# Patient Record
Sex: Male | Born: 1945 | Race: White | Hispanic: No | Marital: Married | Smoking: Former smoker
Health system: Southern US, Community
[De-identification: ages and names within clinical notes are randomized; demographics above are authoritative.]

## PROBLEM LIST (undated history)

## (undated) DIAGNOSIS — E119 Type 2 diabetes mellitus without complications: Secondary | ICD-10-CM

## (undated) DIAGNOSIS — IMO0002 Reserved for concepts with insufficient information to code with codable children: Secondary | ICD-10-CM

## (undated) DIAGNOSIS — T7840XA Allergy, unspecified, initial encounter: Secondary | ICD-10-CM

## (undated) DIAGNOSIS — K219 Gastro-esophageal reflux disease without esophagitis: Secondary | ICD-10-CM

## (undated) DIAGNOSIS — I10 Essential (primary) hypertension: Secondary | ICD-10-CM

## (undated) DIAGNOSIS — J45909 Unspecified asthma, uncomplicated: Secondary | ICD-10-CM

## (undated) DIAGNOSIS — E039 Hypothyroidism, unspecified: Secondary | ICD-10-CM

## (undated) DIAGNOSIS — IMO0001 Reserved for inherently not codable concepts without codable children: Secondary | ICD-10-CM

## (undated) DIAGNOSIS — G4730 Sleep apnea, unspecified: Secondary | ICD-10-CM

## (undated) DIAGNOSIS — R21 Rash and other nonspecific skin eruption: Secondary | ICD-10-CM

## (undated) DIAGNOSIS — M5030 Other cervical disc degeneration, unspecified cervical region: Secondary | ICD-10-CM

## (undated) DIAGNOSIS — K4020 Bilateral inguinal hernia, without obstruction or gangrene, not specified as recurrent: Secondary | ICD-10-CM

## (undated) HISTORY — PX: VASECTOMY: SHX75

## (undated) HISTORY — DX: Unspecified asthma, uncomplicated: J45.909

## (undated) HISTORY — DX: Type 2 diabetes mellitus without complications: E11.9

## (undated) HISTORY — PX: OTHER SURGICAL HISTORY: SHX169

## (undated) HISTORY — DX: Allergy, unspecified, initial encounter: T78.40XA

## (undated) HISTORY — DX: Essential (primary) hypertension: I10

## (undated) HISTORY — PX: NASAL SINUS SURGERY: SHX719

---

## 2010-07-05 ENCOUNTER — Observation Stay (HOSPITAL_COMMUNITY)
Admission: AD | Admit: 2010-07-05 | Discharge: 2010-07-06 | Payer: Self-pay | Attending: Family Medicine | Admitting: Family Medicine

## 2010-09-26 LAB — GLUCOSE, CAPILLARY

## 2010-09-26 LAB — GLUCOSE, RANDOM: Glucose, Bld: 491 mg/dL — ABNORMAL HIGH (ref 70–99)

## 2011-02-24 ENCOUNTER — Other Ambulatory Visit: Payer: Self-pay | Admitting: Otolaryngology

## 2011-02-24 DIAGNOSIS — H7409 Tympanosclerosis, unspecified ear: Secondary | ICD-10-CM

## 2011-02-24 DIAGNOSIS — H6980 Other specified disorders of Eustachian tube, unspecified ear: Secondary | ICD-10-CM

## 2011-02-24 DIAGNOSIS — H908 Mixed conductive and sensorineural hearing loss, unspecified: Secondary | ICD-10-CM

## 2011-02-27 ENCOUNTER — Ambulatory Visit
Admission: RE | Admit: 2011-02-27 | Discharge: 2011-02-27 | Disposition: A | Discharge status: Home / Self Care | Payer: Medicare Other | Source: Ambulatory Visit | Attending: Otolaryngology | Admitting: Otolaryngology

## 2011-02-27 DIAGNOSIS — H7409 Tympanosclerosis, unspecified ear: Secondary | ICD-10-CM

## 2011-02-27 DIAGNOSIS — H908 Mixed conductive and sensorineural hearing loss, unspecified: Secondary | ICD-10-CM

## 2011-02-27 DIAGNOSIS — H6980 Other specified disorders of Eustachian tube, unspecified ear: Secondary | ICD-10-CM

## 2011-04-17 ENCOUNTER — Encounter (HOSPITAL_BASED_OUTPATIENT_CLINIC_OR_DEPARTMENT_OTHER)
Admission: RE | Admit: 2011-04-17 | Discharge: 2011-04-17 | Disposition: A | Discharge status: Home / Self Care | Payer: Medicare Other | Source: Ambulatory Visit | Attending: Orthopaedic Surgery | Admitting: Orthopaedic Surgery

## 2011-04-17 LAB — BASIC METABOLIC PANEL
CO2: 25 mEq/L (ref 19–32)
Chloride: 100 mEq/L (ref 96–112)
Creatinine, Ser: 0.94 mg/dL (ref 0.50–1.35)
Potassium: 4.4 mEq/L (ref 3.5–5.1)
Sodium: 135 mEq/L (ref 135–145)

## 2011-04-18 ENCOUNTER — Ambulatory Visit (HOSPITAL_BASED_OUTPATIENT_CLINIC_OR_DEPARTMENT_OTHER)
Admission: RE | Admit: 2011-04-18 | Discharge: 2011-04-18 | Disposition: A | Discharge status: Home / Self Care | Payer: Medicare Other | Source: Ambulatory Visit | Attending: Orthopaedic Surgery | Admitting: Orthopaedic Surgery

## 2011-04-18 DIAGNOSIS — M249 Joint derangement, unspecified: Secondary | ICD-10-CM | POA: Insufficient documentation

## 2011-04-18 DIAGNOSIS — M719 Bursopathy, unspecified: Secondary | ICD-10-CM | POA: Insufficient documentation

## 2011-04-18 DIAGNOSIS — Z01810 Encounter for preprocedural cardiovascular examination: Secondary | ICD-10-CM | POA: Insufficient documentation

## 2011-04-18 DIAGNOSIS — J45909 Unspecified asthma, uncomplicated: Secondary | ICD-10-CM | POA: Insufficient documentation

## 2011-04-18 DIAGNOSIS — M67919 Unspecified disorder of synovium and tendon, unspecified shoulder: Secondary | ICD-10-CM | POA: Insufficient documentation

## 2011-04-18 DIAGNOSIS — M25819 Other specified joint disorders, unspecified shoulder: Secondary | ICD-10-CM | POA: Insufficient documentation

## 2011-04-18 DIAGNOSIS — E119 Type 2 diabetes mellitus without complications: Secondary | ICD-10-CM | POA: Insufficient documentation

## 2011-04-18 DIAGNOSIS — Z01812 Encounter for preprocedural laboratory examination: Secondary | ICD-10-CM | POA: Insufficient documentation

## 2011-04-18 DIAGNOSIS — I10 Essential (primary) hypertension: Secondary | ICD-10-CM | POA: Insufficient documentation

## 2011-04-18 DIAGNOSIS — K219 Gastro-esophageal reflux disease without esophagitis: Secondary | ICD-10-CM | POA: Insufficient documentation

## 2011-04-18 LAB — GLUCOSE, CAPILLARY
Glucose-Capillary: 79 mg/dL (ref 70–99)
Glucose-Capillary: 132 mg/dL — ABNORMAL HIGH (ref 70–99)

## 2011-04-18 LAB — POCT HEMOGLOBIN-HEMACUE: Hemoglobin: 13.1 g/dL (ref 13.0–17.0)

## 2011-05-11 NOTE — Op Note (Signed)
NAMEMarland Pitts  BLAISE, GRIESHABER NO.:  [id number]  MEDICAL RECORD NO.:  [id number]  LOCATION:                                 FACILITY:  PHYSICIAN:  Lubertha Basque. Miko Sirico, M.D.DATE OF BIRTH:  07/01/1946  DATE OF PROCEDURE:  04/18/2011 DATE OF DISCHARGE:                              OPERATIVE REPORT   PREOPERATIVE DIAGNOSES: 1. Right shoulder impingement. 2. Right shoulder acromioclavicular degeneration.  POSTOPERATIVE DIAGNOSES: 1. Right shoulder impingement. 2. Right shoulder acromioclavicular degeneration.  PROCEDURE: 1. Right shoulder arthroscopic acromioplasty. 2. Right shoulder arthroscopic acromioclavicular resection. 3. Right shoulder arthroscopic debridement.  ANESTHESIA:  General and block.  ATTENDING SURGEON:  Lubertha Basque. Jerl Santos, MD  ASSISTANT:  Lindwood Qua, PA   INDICATIONS FOR PROCEDURE:  The patient is a 65 year old man with several years of right knee pain.  This has persisted despite anexercise program and injection.  By MRI scan, he has things consistent with impingement related to the shape of his acromion and the [facility] joint. He has pain which limits his ability to rest and use his arm and he is offered an arthroscopy.  Informed operative consent was obtained after discussion of possible complications including reaction to anesthesia and infection.  SUMMARY OF FINDINGS AND PROCEDURE:  Under general anesthesia and a block, a right shoulder arthroscopy was performed.  Glenohumeral joint showed no degenerative changes and the biceps tendon appeared intact. There were partial-thickness tears of the rotator cuff seen from below but they did not constitute more than about 10% of the thickness of the cuff.  In the subacromial space, he had some bursitis addressed with a thorough debridement.  He had a prominent subacromial morphology addressed with acromioplasty back to a flat surface.  He also had bone-on- bone contact at the [facility] joint and a  resection was done here as well.  DESCRIPTION OF PROCEDURE:  The patient was taken to the operating suite where general anesthetic was applied without difficulty.  He was also given a block in the preanesthesia area.  He was positioned in beach- chair position and prepped and draped in normal sterile fashion.  After administration of IV Kefzol, an arthroscopy of the right shoulder was performed through a total of 3 portals.  Findings were as noted above, and the procedure consisted of the debridement followed by an acromioplasty.  This was done with a bur in the lateral position followed by transfer of the bur to the posterior position.  We then performed a formal AC decompression removing the distal clavicle with the bur in the lateral position followed by transfer of the bur to the anterior position.  The cuff was thoroughly examined and no tear worthy of repair was found.  The shoulder was thoroughly irrigated followed by reapproximation of portals loosely with nylon.  Adaptic was applied followed by dry gauze and tape.  Estimated blood loss and intraoperative fluids can be obtained from anesthesia records.  DISPOSITION:  The patient was extubated in the operating room and taken to recovery room in stable addition.  He was to go home the same-day and follow up in the office in less than a week.  I will contact him by phone tonight.  Lubertha Basque Jerl Santos, M.D.     PGD/MEDQ  D:  04/18/2011  T:  04/18/2011  Job:  [id number]  Electronically Signed by Marcene Corning M.D. on 05/11/2011 01:41:22 PM

## 2011-07-19 DIAGNOSIS — M542 Cervicalgia: Secondary | ICD-10-CM | POA: Diagnosis not present

## 2011-08-30 DIAGNOSIS — M542 Cervicalgia: Secondary | ICD-10-CM | POA: Diagnosis not present

## 2011-09-05 DIAGNOSIS — M542 Cervicalgia: Secondary | ICD-10-CM | POA: Diagnosis not present

## 2011-09-11 DIAGNOSIS — M542 Cervicalgia: Secondary | ICD-10-CM | POA: Diagnosis not present

## 2011-09-13 DIAGNOSIS — M542 Cervicalgia: Secondary | ICD-10-CM | POA: Diagnosis not present

## 2011-09-15 DIAGNOSIS — M542 Cervicalgia: Secondary | ICD-10-CM | POA: Diagnosis not present

## 2011-09-18 DIAGNOSIS — M542 Cervicalgia: Secondary | ICD-10-CM | POA: Diagnosis not present

## 2011-09-20 DIAGNOSIS — M542 Cervicalgia: Secondary | ICD-10-CM | POA: Diagnosis not present

## 2011-09-22 DIAGNOSIS — M542 Cervicalgia: Secondary | ICD-10-CM | POA: Diagnosis not present

## 2011-09-22 DIAGNOSIS — M25519 Pain in unspecified shoulder: Secondary | ICD-10-CM | POA: Diagnosis not present

## 2011-09-25 DIAGNOSIS — M25519 Pain in unspecified shoulder: Secondary | ICD-10-CM | POA: Diagnosis not present

## 2011-09-25 DIAGNOSIS — M542 Cervicalgia: Secondary | ICD-10-CM | POA: Diagnosis not present

## 2011-09-27 DIAGNOSIS — M542 Cervicalgia: Secondary | ICD-10-CM | POA: Diagnosis not present

## 2011-09-29 DIAGNOSIS — M542 Cervicalgia: Secondary | ICD-10-CM | POA: Diagnosis not present

## 2011-10-04 DIAGNOSIS — M542 Cervicalgia: Secondary | ICD-10-CM | POA: Diagnosis not present

## 2011-10-09 DIAGNOSIS — M5030 Other cervical disc degeneration, unspecified cervical region: Secondary | ICD-10-CM | POA: Diagnosis not present

## 2011-10-11 DIAGNOSIS — M542 Cervicalgia: Secondary | ICD-10-CM | POA: Diagnosis not present

## 2011-10-23 DIAGNOSIS — M5412 Radiculopathy, cervical region: Secondary | ICD-10-CM | POA: Diagnosis not present

## 2011-11-20 DIAGNOSIS — M542 Cervicalgia: Secondary | ICD-10-CM | POA: Diagnosis not present

## 2011-11-28 DIAGNOSIS — M5412 Radiculopathy, cervical region: Secondary | ICD-10-CM | POA: Diagnosis not present

## 2011-12-18 DIAGNOSIS — M542 Cervicalgia: Secondary | ICD-10-CM | POA: Diagnosis not present

## 2012-02-12 DIAGNOSIS — H10019 Acute follicular conjunctivitis, unspecified eye: Secondary | ICD-10-CM | POA: Diagnosis not present

## 2012-02-12 DIAGNOSIS — J0110 Acute frontal sinusitis, unspecified: Secondary | ICD-10-CM | POA: Diagnosis not present

## 2012-04-08 DIAGNOSIS — L609 Nail disorder, unspecified: Secondary | ICD-10-CM | POA: Diagnosis not present

## 2012-04-08 DIAGNOSIS — D239 Other benign neoplasm of skin, unspecified: Secondary | ICD-10-CM | POA: Diagnosis not present

## 2012-04-08 DIAGNOSIS — D485 Neoplasm of uncertain behavior of skin: Secondary | ICD-10-CM | POA: Diagnosis not present

## 2012-04-08 DIAGNOSIS — L821 Other seborrheic keratosis: Secondary | ICD-10-CM | POA: Diagnosis not present

## 2012-04-08 DIAGNOSIS — L819 Disorder of pigmentation, unspecified: Secondary | ICD-10-CM | POA: Diagnosis not present

## 2012-04-30 DIAGNOSIS — Z23 Encounter for immunization: Secondary | ICD-10-CM | POA: Diagnosis not present

## 2012-05-06 DIAGNOSIS — M5412 Radiculopathy, cervical region: Secondary | ICD-10-CM | POA: Diagnosis not present

## 2012-07-23 DIAGNOSIS — L608 Other nail disorders: Secondary | ICD-10-CM | POA: Diagnosis not present

## 2012-07-23 DIAGNOSIS — L570 Actinic keratosis: Secondary | ICD-10-CM | POA: Diagnosis not present

## 2012-11-06 ENCOUNTER — Ambulatory Visit (INDEPENDENT_AMBULATORY_CARE_PROVIDER_SITE_OTHER): Payer: Medicare Other | Admitting: Family Medicine

## 2012-11-06 VITALS — BP 118/76 | HR 84 | Temp 98.2°F | Resp 16 | Ht 66.5 in | Wt 175.4 lb

## 2012-11-06 DIAGNOSIS — K4090 Unilateral inguinal hernia, without obstruction or gangrene, not specified as recurrent: Secondary | ICD-10-CM

## 2012-11-06 DIAGNOSIS — M6208 Separation of muscle (nontraumatic), other site: Secondary | ICD-10-CM

## 2012-11-06 DIAGNOSIS — M6200 Separation of muscle (nontraumatic), unspecified site: Secondary | ICD-10-CM | POA: Diagnosis not present

## 2012-11-06 NOTE — Patient Instructions (Addendum)

## 2012-11-06 NOTE — Progress Notes (Signed)
Subjective:    Patient ID: Logan Pitts, male    DOB: [birth year], 67 y.o.   MRN: [id number] Chief Complaint  Patient presents with  . Mass    left lower groin found over weekend denies pain   HPI  About 4d ago, pt felt a large bulge near his left groin. Not painful, just noticed it in shower, new and thinks it is getting a little larger.  It is much more prominent when he stands up, difficult to feel when he is laying flat.  Does not feel round, more squiggly inside, is above and to the left of his testicle, more pronounced today.  No testicular lumps or pain. Spent the week, cleaning out his garden boxes so has been doing a lot of lifting and recently joined a gym, trying to do weight lifting and core work.   No prior h/o hernia. Also over the past few wks has noticed a central protuberance above his belly button that sticks out when he is trying to do sit ups.  Prev worked for the [place] [employer], recently retired so now much more active at [employer] and lots of house renovation projects.  Has to have rotator cuff surgery in Rt shoulder soon.   Past Medical History  Diagnosis Date  . Allergy   . Asthma   . Diabetes mellitus without complication   . Thyroid disease   . Hypertension    Past Surgical History  Procedure Laterality Date  . Nasal sinus surgery    . Shoulder joint surgery     No current outpatient prescriptions on file prior to visit.   No current facility-administered medications on file prior to visit.   No Known Allergies Family History  Problem Relation Age of Onset  . Cancer Mother   . Heart disease Father    History   Social History  . Marital Status: Married    Spouse Name: N/A    Number of Children: N/A  . Years of Education: N/A   Social History Main Topics  . Smoking status: Former Games developer  . Smokeless tobacco: None  . Alcohol Use: None  . Drug Use: None  . Sexually Active: None   Other Topics Concern  . None   Social History Narrative  .  None     Review of Systems  Constitutional: Positive for appetite change. Negative for fever, chills, diaphoresis and activity change.  Respiratory: Negative for shortness of breath.   Cardiovascular: Negative for chest pain.  Gastrointestinal: Positive for abdominal distention. Negative for nausea, vomiting, abdominal pain, diarrhea, constipation, blood in stool, anal bleeding and rectal pain.  Genitourinary: Negative for dysuria, urgency, frequency, hematuria, decreased urine volume, discharge, penile swelling, scrotal swelling, enuresis, genital sores, penile pain and testicular pain.  Musculoskeletal: Positive for arthralgias. Negative for gait problem.  Skin: Negative for color change, rash and wound.  Neurological: Negative for weakness and numbness.  Hematological: Negative for adenopathy. Does not bruise/bleed easily.      BP 118/76  Pulse 84  Temp(Src) 98.2 F (36.8 C) (Oral)  Resp 16  Ht 5' 6.5" (1.689 m)  Wt 175 lb 6.4 oz (79.561 kg)  BMI 27.89 kg/m2  SpO2 98% Objective:   Physical Exam  Constitutional: He is oriented to person, place, and time. He appears well-developed and well-nourished. No distress.  HENT:  Head: Normocephalic and atraumatic.  Eyes: No scleral icterus.  Pulmonary/Chest: Effort normal.  Abdominal: Normal appearance and bowel sounds are normal. There is no tenderness.  A hernia is present. Hernia confirmed positive in the ventral area and confirmed positive in the left inguinal area. Hernia confirmed negative in the right inguinal area.  Medium diastasis recti hernia in epigastric region.  Lymphadenopathy:       Right: No inguinal adenopathy present.       Left: No inguinal adenopathy present.  Neurological: He is alert and oriented to person, place, and time.  Skin: Skin is warm and dry. He is not diaphoretic.  Psychiatric: He has a normal mood and affect. His behavior is normal.          Assessment & Plan:  Left Inguinal hernia - Plan:  Ambulatory referral to General Surgery.  Do not do any intentional weight lifting/traning but ok to continue normal activities, home projects, aerobic exercise.  Can wear truss if getting worse or painful.  Warning given to seek medical care immed for signs of incarceration or strangulation.  Diastasis recti - Plan: Ambulatory referral to General Surgery - likely more cosmetic concern.   Meds ordered this encounter  Medications  . levothyroxine (SYNTHROID, LEVOTHROID) 200 MCG tablet    Sig: Take 200 mcg by mouth daily before breakfast.  . atorvastatin (LIPITOR) 80 MG tablet    Sig: Take 80 mg by mouth daily.  Marland Kitchen ezetimibe (ZETIA) 10 MG tablet    Sig: Take 10 mg by mouth daily.  Marland Kitchen lisinopril (PRINIVIL,ZESTRIL) 40 MG tablet    Sig: Take 40 mg by mouth daily.  Marland Kitchen amLODipine (NORVASC) 10 MG tablet    Sig: Take 10 mg by mouth daily.  . flunisolide (NASALIDE) 25 MCG/ACT (0.025%) SOLN    Sig: Inhale 2 sprays into the lungs 2 (two) times daily.  . insulin glargine (LANTUS) 100 UNIT/ML injection    Sig: Inject into the skin at bedtime.  . insulin aspart protamine- aspart (NOVOLOG 70/30) (70-30) 100 UNIT/ML injection    Sig: Inject into the skin.  Marland Kitchen omeprazole (PRILOSEC) 20 MG capsule    Sig: Take 20 mg by mouth daily.  Marland Kitchen albuterol (PROVENTIL HFA;VENTOLIN HFA) 108 (90 BASE) MCG/ACT inhaler    Sig: Inhale 2 puffs into the lungs every 6 (six) hours as needed for wheezing.  . mometasone (ASMANEX) 220 MCG/INH inhaler    Sig: Inhale 2 puffs into the lungs daily.  Marland Kitchen aspirin 81 MG tablet    Sig: Take 81 mg by mouth daily.  Marland Kitchen ibuprofen (ADVIL,MOTRIN) 200 MG tablet    Sig: Take 200 mg by mouth every 6 (six) hours as needed for pain.  . vardenafil (LEVITRA) 20 MG tablet    Sig: Take 20 mg by mouth daily as needed for erectile dysfunction.

## 2012-11-18 ENCOUNTER — Encounter (INDEPENDENT_AMBULATORY_CARE_PROVIDER_SITE_OTHER): Payer: Self-pay | Admitting: General Surgery

## 2012-11-18 ENCOUNTER — Ambulatory Visit (INDEPENDENT_AMBULATORY_CARE_PROVIDER_SITE_OTHER): Payer: Medicare Other | Admitting: General Surgery

## 2012-11-18 VITALS — BP 144/72 | HR 74 | Temp 98.0°F | Ht 67.5 in | Wt 176.4 lb

## 2012-11-18 DIAGNOSIS — K4020 Bilateral inguinal hernia, without obstruction or gangrene, not specified as recurrent: Secondary | ICD-10-CM

## 2012-11-18 NOTE — Progress Notes (Signed)
Patient ID: Logan Pitts, male   DOB: September 01, 1945, 67 y.o.   MRN: [id number]  Chief Complaint  Patient presents with  . New Evaluation    eval LIH    HPI Logan Pitts is a 67 y.o. male.   HPI  He is referred by Dr. Meriel Pitts for left inguinal hernia.  She resumed A. Exercise routine and was doing squats as well as a lot of crunches. He noticed a bulge in the left inguinal area. He also noticed a bulge in the epigastrium. Dr. Clelia Pitts  examined him and found that he had a diastases recti in the epigastric region. She also felt a left inguinal hernia. He notices a bulge in his left groin as well. He is here to discuss repair.    Past Medical History  Diagnosis Date  . Allergy   . Asthma   . Diabetes mellitus without complication   . Thyroid disease   . Hypertension     Past Surgical History  Procedure Laterality Date  . Nasal sinus surgery    . Shoulder joint surgery      Family History  Problem Relation Age of Onset  . Cancer Mother   . Heart disease Father   . Cancer Sister     Social History History  Substance Use Topics  . Smoking status: Former Games developer  . Smokeless tobacco: Not on file  . Alcohol Use: Yes     Comment: social    No Known Allergies  Current Outpatient Prescriptions  Medication Sig Dispense Refill  . albuterol (PROVENTIL HFA;VENTOLIN HFA) 108 (90 BASE) MCG/ACT inhaler Inhale 2 puffs into the lungs every 6 (six) hours as needed for wheezing.      Marland Kitchen amLODipine (NORVASC) 10 MG tablet Take 10 mg by mouth daily.      Marland Kitchen aspirin 81 MG tablet Take 81 mg by mouth daily.      Marland Kitchen atorvastatin (LIPITOR) 80 MG tablet Take 80 mg by mouth daily.      Marland Kitchen ezetimibe (ZETIA) 10 MG tablet Take 10 mg by mouth daily.      . flunisolide (NASALIDE) 25 MCG/ACT (0.025%) SOLN Inhale 2 sprays into the lungs 2 (two) times daily.      Marland Kitchen ibuprofen (ADVIL,MOTRIN) 200 MG tablet Take 200 mg by mouth every 6 (six) hours as needed for pain.      Marland Kitchen insulin aspart protamine- aspart  (NOVOLOG 70/30) (70-30) 100 UNIT/ML injection Inject into the skin.      Marland Kitchen insulin glargine (LANTUS) 100 UNIT/ML injection Inject into the skin at bedtime.      Marland Kitchen levothyroxine (SYNTHROID, LEVOTHROID) 200 MCG tablet Take 200 mcg by mouth daily before breakfast.      . lisinopril (PRINIVIL,ZESTRIL) 40 MG tablet Take 40 mg by mouth daily.      . meloxicam (MOBIC) 15 MG tablet       . mometasone (ASMANEX) 220 MCG/INH inhaler Inhale 2 puffs into the lungs daily.      Marland Kitchen omeprazole (PRILOSEC) 20 MG capsule Take 20 mg by mouth daily.      . vardenafil (LEVITRA) 20 MG tablet Take 20 mg by mouth daily as needed for erectile dysfunction.       No current facility-administered medications for this visit.    Review of Systems Review of Systems  Constitutional: Negative.   HENT: Negative.   Respiratory: Negative.   Cardiovascular: Negative.   Gastrointestinal: Negative.   Genitourinary: Negative for difficulty urinating.  Musculoskeletal: Positive  for arthralgias.  Hematological: Negative.     Blood pressure 144/72, pulse 74, temperature 98 F (36.7 C), temperature source Temporal, height 5' 7.5" (1.715 m), weight 176 lb 6.4 oz (80.015 kg), SpO2 96.00%.  Physical Exam Physical Exam  Constitutional: He appears well-developed and well-nourished. No distress.  HENT:  Head: Normocephalic and atraumatic.  Neck: Neck supple.  Cardiovascular: Normal rate and regular rhythm.   Pulmonary/Chest: Effort normal and breath sounds normal.  Abdominal: Soft. He exhibits no distension and no mass. There is no tenderness.  Tiny umbilical hernia.  Diastases recti is present.  Genitourinary:  Reducible, visible left inguinal bulge. Tiny right inguinal bulge only felt with a cough.  Musculoskeletal: He exhibits no edema.  Lymphadenopathy:    He has no cervical adenopathy.  Skin: Skin is warm and dry.  Psychiatric: He has a normal mood and affect. His behavior is normal.    Data Reviewed Dr. Alver Pitts  note.  Assessment    Bilateral inguinal hernias left significantly larger than the right. Also has a tiny umbilical hernia.     Plan    We discussed left inguinal hernia with mesh and On Q pump placement by way of open technique. We also discussed laparoscopic bilateral inguinal hernia repair. He is only interested in repairing the left side at this time by way of the open technique.   I have explained the procedure, risks, and aftercare of inguinal hernia repair.  Risks include but are not limited to bleeding, infection, wound problems, anesthesia, recurrence, bladder or intestine injury, urinary retention, testicular dysfunction, chronic pain, mesh problems. He seems to understand all this. He would like to go home and discuss with his wife and calls back when he would like to schedule her surgery. I've also discussed the importance of good blood sugar control in the perioperative period.       Logan Pitts J 11/18/2012, 2:51 PM

## 2012-11-18 NOTE — Patient Instructions (Signed)
Avoid heavy abdominal exercises and heavy weight lifting. Please call us when you would like to schedule repair of the left inguinal hernia.

## 2013-01-20 DIAGNOSIS — M5412 Radiculopathy, cervical region: Secondary | ICD-10-CM | POA: Diagnosis not present

## 2013-02-04 DIAGNOSIS — M5412 Radiculopathy, cervical region: Secondary | ICD-10-CM | POA: Diagnosis not present

## 2013-03-21 DIAGNOSIS — M542 Cervicalgia: Secondary | ICD-10-CM | POA: Diagnosis not present

## 2013-04-16 DIAGNOSIS — D239 Other benign neoplasm of skin, unspecified: Secondary | ICD-10-CM | POA: Diagnosis not present

## 2013-04-16 DIAGNOSIS — L821 Other seborrheic keratosis: Secondary | ICD-10-CM | POA: Diagnosis not present

## 2013-04-16 DIAGNOSIS — B354 Tinea corporis: Secondary | ICD-10-CM | POA: Diagnosis not present

## 2013-04-16 DIAGNOSIS — D485 Neoplasm of uncertain behavior of skin: Secondary | ICD-10-CM | POA: Diagnosis not present

## 2013-04-16 DIAGNOSIS — D692 Other nonthrombocytopenic purpura: Secondary | ICD-10-CM | POA: Diagnosis not present

## 2013-04-16 DIAGNOSIS — L570 Actinic keratosis: Secondary | ICD-10-CM | POA: Diagnosis not present

## 2013-05-27 DIAGNOSIS — D485 Neoplasm of uncertain behavior of skin: Secondary | ICD-10-CM | POA: Diagnosis not present

## 2013-05-27 DIAGNOSIS — L820 Inflamed seborrheic keratosis: Secondary | ICD-10-CM | POA: Diagnosis not present

## 2013-07-25 ENCOUNTER — Encounter (INDEPENDENT_AMBULATORY_CARE_PROVIDER_SITE_OTHER): Payer: Self-pay | Admitting: General Surgery

## 2013-07-25 ENCOUNTER — Ambulatory Visit (INDEPENDENT_AMBULATORY_CARE_PROVIDER_SITE_OTHER): Payer: Medicare Other | Admitting: General Surgery

## 2013-07-25 VITALS — BP 128/68 | HR 84 | Temp 98.9°F | Resp 14 | Ht 67.5 in | Wt 186.2 lb

## 2013-07-25 DIAGNOSIS — K4020 Bilateral inguinal hernia, without obstruction or gangrene, not specified as recurrent: Secondary | ICD-10-CM

## 2013-07-25 NOTE — Progress Notes (Signed)
Patient ID: Logan Pitts, male   DOB: July 22, 1945, 68 y.o.   MRN: [id number]  Chief Complaint  Patient presents with  . Routine Post Op    reck hernia and discuss sx    HPI Logan Pitts is a 68 y.o. male.   HPI  I saw Logan Pitts back in May of 2014. At that time he has a symptomatic left inguinal hernia. He also had a small right inguinal hernia and a tiny umbilical hernia. He had some work to do over the summer so as not interested in repair that time. He presents now to discuss repair.  Past Medical History  Diagnosis Date  . Allergy   . Asthma   . Diabetes mellitus without complication   . Thyroid disease   . Hypertension     Past Surgical History  Procedure Laterality Date  . Nasal sinus surgery    . Shoulder joint surgery      Family History  Problem Relation Age of Onset  . Cancer Mother   . Heart disease Father   . Cancer Sister     Social History History  Substance Use Topics  . Smoking status: Former Research scientist (life sciences)  . Smokeless tobacco: Not on file  . Alcohol Use: Yes     Comment: social    No Known Allergies  Current Outpatient Prescriptions  Medication Sig Dispense Refill  . albuterol (PROVENTIL HFA;VENTOLIN HFA) 108 (90 BASE) MCG/ACT inhaler Inhale 2 puffs into the lungs every 6 (six) hours as needed for wheezing.      Marland Kitchen amLODipine (NORVASC) 10 MG tablet Take 10 mg by mouth daily.      Marland Kitchen aspirin 81 MG tablet Take 81 mg by mouth daily.      Marland Kitchen atorvastatin (LIPITOR) 80 MG tablet Take 80 mg by mouth daily.      Marland Kitchen ezetimibe (ZETIA) 10 MG tablet Take 10 mg by mouth daily.      . flunisolide (NASALIDE) 25 MCG/ACT (0.025%) SOLN Inhale 2 sprays into the lungs 2 (two) times daily.      Marland Kitchen ibuprofen (ADVIL,MOTRIN) 200 MG tablet Take 200 mg by mouth every 6 (six) hours as needed for pain.      Marland Kitchen insulin aspart protamine- aspart (NOVOLOG 70/30) (70-30) 100 UNIT/ML injection Inject into the skin.      Marland Kitchen insulin glargine (LANTUS) 100 UNIT/ML injection Inject into the  skin at bedtime.      Marland Kitchen levothyroxine (SYNTHROID, LEVOTHROID) 200 MCG tablet Take 200 mcg by mouth daily before breakfast.      . lisinopril (PRINIVIL,ZESTRIL) 40 MG tablet Take 40 mg by mouth daily.      . meloxicam (MOBIC) 15 MG tablet       . mometasone (ASMANEX) 220 MCG/INH inhaler Inhale 2 puffs into the lungs daily.      Marland Kitchen omeprazole (PRILOSEC) 20 MG capsule Take 20 mg by mouth daily.      Marland Kitchen PRECISION XTRA TEST STRIPS test strip       . TECHLITE LANCETS MISC       . vardenafil (LEVITRA) 20 MG tablet Take 20 mg by mouth daily as needed for erectile dysfunction.       No current facility-administered medications for this visit.    Review of Systems Review of Systems  Constitutional: Negative.   Gastrointestinal: Negative for constipation.  Genitourinary: Negative for difficulty urinating.  Hematological: Bruises/bleeds easily.    Blood pressure 128/68, pulse 84, temperature 98.9 F (37.2 C), temperature source  Temporal, resp. rate 14, height 5' 7.5" (1.715 m), weight 186 lb 3.2 oz (84.46 kg).  Physical Exam Physical Exam  Constitutional: He appears well-developed and well-nourished. No distress.  HENT:  Head: Normocephalic and atraumatic.  Eyes: No scleral icterus.  Cardiovascular: Normal rate and regular rhythm.   Pulmonary/Chest: Effort normal and breath sounds normal.  Abdominal: Soft. He exhibits no mass. There is no tenderness.  He has a diastases recti in the epigastric region. He has a very tiny umbilical hernia.  Genitourinary:  There is a moderate sized left inguinal bulge that is reducible. There is a small right inguinal bulge felt with a cough.  Musculoskeletal: He exhibits no edema.    Data Reviewed Previous note  Assessment    Bilateral inguinal hernias. He is interested in having both repaired.     Plan    Laparoscopic bilateral inguinal hernia repair with mesh.    I have explained the procedure, risks, and aftercare of inguinal hernia repair.   Risks include but are not limited to bleeding, infection, wound problems, anesthesia, recurrence, bladder or intestine injury, urinary retention, testicular dysfunction, chronic pain, mesh problems.  He seems to understand and agrees to proceed.  I have asked him to stop his aspirin one week prior to surgery.       Oline Belk J 07/25/2013, 4:14 PM

## 2013-07-25 NOTE — Patient Instructions (Signed)
[facility] _______[facility]  UMBILICAL OR INGUINAL HERNIA REPAIR: POST OP INSTRUCTIONS  Always review your discharge instruction sheet given to you by the facility where your surgery was performed. IF YOU HAVE DISABILITY OR FAMILY LEAVE FORMS, YOU MUST BRING THEM TO THE OFFICE FOR PROCESSING.   DO NOT GIVE THEM TO YOUR DOCTOR.  1. A  prescription for pain medication may be given to you upon discharge.  Take your pain medication as prescribed, if needed.  If narcotic pain medicine is not needed, then you may take acetaminophen (Tylenol) or ibuprofen (Advil) as needed. 2. Take your usually prescribed medications unless otherwise directed. 3. If you need a refill on your pain medication, please contact your pharmacy.  They will contact our office to request authorization. Prescriptions will not be filled after 5 pm or on week-ends. 4. You should follow a light diet the first 24 hours after arrival home, such as soup and crackers, etc.  Be sure to include lots of fluids daily.  Resume your normal diet the day after surgery. 5. Most patients will experience some swelling and bruising around the umbilicus or in the groin and scrotum.  Ice packs and reclining will help.  Swelling and bruising can take several days to resolve.  6. It is common to experience some constipation if taking pain medication after surgery.  Increasing fluid intake and taking a stool softener (such as Colace) will usually help or prevent this problem from occurring.  A mild laxative (Milk of Magnesia or Miralax) should be taken according to package directions if there are no bowel movements after 48 hours. 7. Unless discharge instructions indicate otherwise, you may remove your bandages 24-48 hours after surgery, and you may shower at that time.  You may have steri-strips (small skin tapes) in place directly over the incision.  These strips should be left on the skin for 7-10 days.  If your surgeon used skin glue on the  incision, you may shower in 24 hours.  The glue will flake off over the next 2-3 weeks.  Any sutures or staples will be removed at the office during your follow-up visit. 8. ACTIVITIES:  You may resume regular (light) daily activities beginning the next day--such as daily self-care, walking, climbing stairs--gradually increasing activities as tolerated.  You may have sexual intercourse when it is comfortable.  Refrain from any heavy lifting or straining until approved by your doctor. a. You may drive when you are no longer taking prescription pain medication, you can comfortably wear a seatbelt, and you can safely maneuver your car and apply brakes. b. RETURN TO WORK:  __________________________________________________________ 9. You should see your doctor in the office for a follow-up appointment approximately 2-3 weeks after your surgery.  Make sure that you call for this appointment within a day or two after you arrive home to insure a convenient appointment time. 10. OTHER INSTRUCTIONS:  __________________________________________________________________________________________________________________________________________________________________________________________  WHEN TO CALL YOUR DOCTOR: 1. Fever over 101.0 2. Inability to urinate 3. Nausea and/or vomiting 4. Extreme swelling or bruising 5. Continued bleeding from incision. 6. Increased pain, redness, or drainage from the incision  The clinic staff is available to answer your questions during regular business hours.  Please don't hesitate to call and ask to speak to one of the nurses for clinical concerns.  If you have a medical emergency, go to the nearest emergency room or call 911.  A surgeon from [facility] is always on call at the hospital     [address], [address], [place], [place]  [address] ?  [address] [address], [place], [place]   [address] [phone] ? [phone] ? FAX [phone] Web site:  [email or web address]  

## 2013-08-21 ENCOUNTER — Encounter (HOSPITAL_COMMUNITY)
Admission: RE | Admit: 2013-08-21 | Discharge: 2013-08-21 | Disposition: A | Discharge status: Home / Self Care | Payer: Medicare Other | Source: Ambulatory Visit | Attending: General Surgery | Admitting: General Surgery

## 2013-08-21 ENCOUNTER — Ambulatory Visit (HOSPITAL_COMMUNITY)
Admission: RE | Admit: 2013-08-21 | Discharge: 2013-08-21 | Disposition: A | Discharge status: Home / Self Care | Payer: Medicare Other | Source: Ambulatory Visit | Attending: General Surgery | Admitting: General Surgery

## 2013-08-21 ENCOUNTER — Encounter (HOSPITAL_COMMUNITY): Payer: Self-pay

## 2013-08-21 ENCOUNTER — Encounter (HOSPITAL_COMMUNITY): Payer: Self-pay | Admitting: Pharmacy Technician

## 2013-08-21 DIAGNOSIS — K4020 Bilateral inguinal hernia, without obstruction or gangrene, not specified as recurrent: Secondary | ICD-10-CM | POA: Diagnosis not present

## 2013-08-21 DIAGNOSIS — Z01818 Encounter for other preprocedural examination: Secondary | ICD-10-CM | POA: Diagnosis not present

## 2013-08-21 DIAGNOSIS — Z01812 Encounter for preprocedural laboratory examination: Secondary | ICD-10-CM | POA: Insufficient documentation

## 2013-08-21 DIAGNOSIS — Z01810 Encounter for preprocedural cardiovascular examination: Secondary | ICD-10-CM | POA: Insufficient documentation

## 2013-08-21 DIAGNOSIS — I10 Essential (primary) hypertension: Secondary | ICD-10-CM | POA: Diagnosis not present

## 2013-08-21 HISTORY — DX: Rash and other nonspecific skin eruption: R21

## 2013-08-21 HISTORY — DX: Sleep apnea, unspecified: G47.30

## 2013-08-21 HISTORY — DX: Reserved for inherently not codable concepts without codable children: IMO0001

## 2013-08-21 HISTORY — DX: Reserved for concepts with insufficient information to code with codable children: IMO0002

## 2013-08-21 HISTORY — DX: Gastro-esophageal reflux disease without esophagitis: K21.9

## 2013-08-21 HISTORY — DX: Other cervical disc degeneration, unspecified cervical region: M50.30

## 2013-08-21 HISTORY — DX: Bilateral inguinal hernia, without obstruction or gangrene, not specified as recurrent: K40.20

## 2013-08-21 HISTORY — DX: Hypothyroidism, unspecified: E03.9

## 2013-08-21 LAB — CBC WITH DIFFERENTIAL/PLATELET
BASOS PCT: 1 % (ref 0–1)
Basophils Absolute: 0 10*3/uL (ref 0.0–0.1)
Eosinophils Absolute: 0.2 10*3/uL (ref 0.0–0.7)
Eosinophils Relative: 4 % (ref 0–5)
HEMATOCRIT: 40.8 % (ref 39.0–52.0)
HEMOGLOBIN: 14.2 g/dL (ref 13.0–17.0)
Lymphocytes Relative: 29 % (ref 12–46)
Lymphs Abs: 1.4 10*3/uL (ref 0.7–4.0)
MCH: 31.6 pg (ref 26.0–34.0)
MCHC: 34.8 g/dL (ref 30.0–36.0)
MCV: 90.7 fL (ref 78.0–100.0)
MONO ABS: 0.4 10*3/uL (ref 0.1–1.0)
MONOS PCT: 9 % (ref 3–12)
NEUTROS ABS: 2.8 10*3/uL (ref 1.7–7.7)
Neutrophils Relative %: 58 % (ref 43–77)
Platelets: 240 10*3/uL (ref 150–400)
RBC: 4.5 MIL/uL (ref 4.22–5.81)
RDW: 13 % (ref 11.5–15.5)
WBC: 4.9 10*3/uL (ref 4.0–10.5)

## 2013-08-21 LAB — COMPREHENSIVE METABOLIC PANEL
ALT: 23 U/L (ref 0–53)
AST: 23 U/L (ref 0–37)
Albumin: 4.2 g/dL (ref 3.5–5.2)
Alkaline Phosphatase: 72 U/L (ref 39–117)
BILIRUBIN TOTAL: 1.7 mg/dL — AB (ref 0.3–1.2)
BUN: 19 mg/dL (ref 6–23)
CO2: 26 mEq/L (ref 19–32)
CREATININE: 1.16 mg/dL (ref 0.50–1.35)
Calcium: 9.2 mg/dL (ref 8.4–10.5)
Chloride: 102 mEq/L (ref 96–112)
GFR calc Af Amer: 73 mL/min — ABNORMAL LOW (ref >90)
GFR calc non Af Amer: 63 mL/min — ABNORMAL LOW (ref >90)
Glucose, Bld: 78 mg/dL (ref 70–99)
Potassium: 4.5 mEq/L (ref 3.7–5.3)
Sodium: 138 mEq/L (ref 137–147)
Total Protein: 7.1 g/dL (ref 6.0–8.3)

## 2013-08-21 LAB — PROTIME-INR
INR: 0.98 (ref 0.00–1.49)
PROTHROMBIN TIME: 12.8 s (ref 11.6–15.2)

## 2013-08-21 NOTE — Patient Instructions (Addendum)
Logan Pitts  08/21/2013                           YOUR PROCEDURE IS SCHEDULED ON: 08/26/13               PLEASE REPORT TO SHORT STAY CENTER AT : 5:30 AM               CALL THIS NUMBER IF ANY PROBLEMS THE DAY OF SURGERY :               [phone]                                REMEMBER:   Do not eat food or drink liquids AFTER MIDNIGHT     Take these medicines the morning of surgery with A SIP OF WATER:  AMLODIPINE / LIPITOR / ZETIA / SYNTHROID / PRILOSEC / MAY TAKE HUMIBID - SUDAFED IF NEEDED / TAKE 1/2 DOSE OF LANTUS INSULIN THE NIGHT BEFORE SURGERY   Do not wear jewelry, make-up   Do not wear lotions, powders, or perfumes.   Do not shave legs or underarms 12 hrs. before surgery (men may shave face)  Do not bring valuables to the hospital.  Contacts, dentures or bridgework may not be worn into surgery.  Leave suitcase in the car. After surgery it may be brought to your room.                           Patients discharged the day of surgery will not be allowed to drive home.              If going home same day of surgery, must have someone stay with you first              24 hrs at home and arrange for some one to drive you home from hospital.    Special Instructions:   Please read over the following fact sheets that you were given:               1. [place]                2. DISCONTINUE ASPIRIN AND HERBAL MEDS 5 DAYS PREOP               3. BRING C PAP MASK AND TUBING TO HOSPITAL               4. BRING INHALER (ALBUTEROL) [name]                                                X_____________________________________________________________________        Failure to follow these instructions may result in cancellation of your surgery

## 2013-08-25 ENCOUNTER — Encounter (HOSPITAL_COMMUNITY): Payer: Self-pay | Admitting: Anesthesiology

## 2013-08-25 NOTE — Anesthesia Preprocedure Evaluation (Signed)
Anesthesia Evaluation  Patient identified by MRN, date of birth, ID band Patient awake    Reviewed: Allergy & Precautions, H&P , NPO status , Patient's Chart, lab work & pertinent test results  Airway Mallampati: II TM Distance: >3 FB Neck ROM: Full    Dental no notable dental hx.    Pulmonary asthma , sleep apnea , former smoker,  breath sounds clear to auscultation  Pulmonary exam normal       Cardiovascular Exercise Tolerance: Good hypertension, Pt. on medications Rhythm:Regular Rate:Normal     Neuro/Psych negative neurological ROS  negative psych ROS   GI/Hepatic Neg liver ROS, GERD-  Medicated,  Endo/Other  diabetes, Type 1, Insulin DependentHypothyroidism   Renal/GU negative Renal ROS  negative genitourinary   Musculoskeletal negative musculoskeletal ROS (+)   Abdominal   Peds negative pediatric ROS (+)  Hematology negative hematology ROS (+)   Anesthesia Other Findings   Reproductive/Obstetrics negative OB ROS                           Anesthesia Physical Anesthesia Plan  ASA: III  Anesthesia Plan: General   Post-op Pain Management:    Induction: Intravenous  Airway Management Planned: Oral ETT  Additional Equipment:   Intra-op Plan:   Post-operative Plan: Extubation in OR  Informed Consent: I have reviewed the patients History and Physical, chart, labs and discussed the procedure including the risks, benefits and alternatives for the proposed anesthesia with the patient or authorized representative who has indicated his/her understanding and acceptance.   Dental advisory given  Plan Discussed with: CRNA  Anesthesia Plan Comments:         Anesthesia Quick Evaluation

## 2013-08-26 ENCOUNTER — Encounter (HOSPITAL_COMMUNITY): Payer: Self-pay | Admitting: *Deleted

## 2013-08-26 ENCOUNTER — Ambulatory Visit (HOSPITAL_COMMUNITY)
Admission: RE | Admit: 2013-08-26 | Discharge: 2013-08-26 | Disposition: A | Discharge status: Home / Self Care | Payer: Medicare Other | Source: Ambulatory Visit | Attending: General Surgery | Admitting: General Surgery

## 2013-08-26 ENCOUNTER — Encounter (HOSPITAL_COMMUNITY): Payer: Medicare Other | Admitting: Anesthesiology

## 2013-08-26 ENCOUNTER — Ambulatory Visit (HOSPITAL_COMMUNITY): Payer: Medicare Other | Admitting: Anesthesiology

## 2013-08-26 ENCOUNTER — Encounter (HOSPITAL_COMMUNITY)
Admission: RE | Disposition: A | Discharge status: Home / Self Care | Payer: Self-pay | Source: Ambulatory Visit | Attending: General Surgery

## 2013-08-26 DIAGNOSIS — Z87891 Personal history of nicotine dependence: Secondary | ICD-10-CM | POA: Insufficient documentation

## 2013-08-26 DIAGNOSIS — G4730 Sleep apnea, unspecified: Secondary | ICD-10-CM | POA: Insufficient documentation

## 2013-08-26 DIAGNOSIS — E039 Hypothyroidism, unspecified: Secondary | ICD-10-CM | POA: Diagnosis not present

## 2013-08-26 DIAGNOSIS — J45909 Unspecified asthma, uncomplicated: Secondary | ICD-10-CM | POA: Diagnosis not present

## 2013-08-26 DIAGNOSIS — Z79899 Other long term (current) drug therapy: Secondary | ICD-10-CM | POA: Diagnosis not present

## 2013-08-26 DIAGNOSIS — E119 Type 2 diabetes mellitus without complications: Secondary | ICD-10-CM | POA: Diagnosis not present

## 2013-08-26 DIAGNOSIS — Z923 Personal history of irradiation: Secondary | ICD-10-CM | POA: Diagnosis not present

## 2013-08-26 DIAGNOSIS — K219 Gastro-esophageal reflux disease without esophagitis: Secondary | ICD-10-CM | POA: Insufficient documentation

## 2013-08-26 DIAGNOSIS — I10 Essential (primary) hypertension: Secondary | ICD-10-CM | POA: Diagnosis not present

## 2013-08-26 DIAGNOSIS — Z794 Long term (current) use of insulin: Secondary | ICD-10-CM | POA: Insufficient documentation

## 2013-08-26 DIAGNOSIS — Z9852 Vasectomy status: Secondary | ICD-10-CM | POA: Insufficient documentation

## 2013-08-26 DIAGNOSIS — K4020 Bilateral inguinal hernia, without obstruction or gangrene, not specified as recurrent: Secondary | ICD-10-CM | POA: Insufficient documentation

## 2013-08-26 HISTORY — PX: INSERTION OF MESH: SHX5868

## 2013-08-26 HISTORY — PX: INGUINAL HERNIA REPAIR: SHX194

## 2013-08-26 LAB — GLUCOSE, CAPILLARY
GLUCOSE-CAPILLARY: 179 mg/dL — AB (ref 70–99)
GLUCOSE-CAPILLARY: 196 mg/dL — AB (ref 70–99)
Glucose-Capillary: 212 mg/dL — ABNORMAL HIGH (ref 70–99)
Glucose-Capillary: 213 mg/dL — ABNORMAL HIGH (ref 70–99)

## 2013-08-26 SURGERY — REPAIR, HERNIA, INGUINAL, LAPAROSCOPIC
Anesthesia: General | Laterality: Bilateral

## 2013-08-26 MED ORDER — FENTANYL CITRATE 0.05 MG/ML IJ SOLN
25.0000 ug | INTRAMUSCULAR | Status: DC | PRN
  Administered 2013-08-26: 50 ug via INTRAVENOUS

## 2013-08-26 MED ORDER — GLYCOPYRROLATE 0.2 MG/ML IJ SOLN
INTRAMUSCULAR | Status: DC | PRN
  Administered 2013-08-26: 0.6 mg via INTRAVENOUS

## 2013-08-26 MED ORDER — INSULIN ASPART 100 UNIT/ML ~~LOC~~ SOLN
SUBCUTANEOUS | Status: AC
  Filled 2013-08-26: qty 1

## 2013-08-26 MED ORDER — PROPOFOL 10 MG/ML IV BOLUS
INTRAVENOUS | Status: DC | PRN
  Administered 2013-08-26: 160 mg via INTRAVENOUS

## 2013-08-26 MED ORDER — FENTANYL CITRATE 0.05 MG/ML IJ SOLN
INTRAMUSCULAR | Status: AC
  Filled 2013-08-26: qty 2

## 2013-08-26 MED ORDER — ONDANSETRON HCL 4 MG/2ML IJ SOLN: 4.0000 mg | Freq: Four times a day (QID) | INTRAMUSCULAR | Status: DC | PRN

## 2013-08-26 MED ORDER — OXYCODONE HCL 5 MG PO TABS: 5.0000 mg | ORAL_TABLET | ORAL | Status: DC | PRN

## 2013-08-26 MED ORDER — BUPIVACAINE-EPINEPHRINE 0.5% -1:200000 IJ SOLN
INTRAMUSCULAR | Status: AC
  Filled 2013-08-26: qty 1

## 2013-08-26 MED ORDER — PROMETHAZINE HCL 25 MG/ML IJ SOLN: 6.2500 mg | INTRAMUSCULAR | Status: DC | PRN

## 2013-08-26 MED ORDER — PROPOFOL 10 MG/ML IV BOLUS
INTRAVENOUS | Status: AC
  Filled 2013-08-26: qty 20

## 2013-08-26 MED ORDER — BUPIVACAINE-EPINEPHRINE 0.5% -1:200000 IJ SOLN
INTRAMUSCULAR | Status: DC | PRN
  Administered 2013-08-26: 17 mL

## 2013-08-26 MED ORDER — SODIUM CHLORIDE 0.9 % IJ SOLN: 3.0000 mL | INTRAMUSCULAR | Status: DC | PRN

## 2013-08-26 MED ORDER — GLYCOPYRROLATE 0.2 MG/ML IJ SOLN
INTRAMUSCULAR | Status: AC
  Filled 2013-08-26: qty 3

## 2013-08-26 MED ORDER — CEFAZOLIN SODIUM-DEXTROSE 2-3 GM-% IV SOLR
2.0000 g | INTRAVENOUS | Status: AC
  Administered 2013-08-26: 2 g via INTRAVENOUS

## 2013-08-26 MED ORDER — LIDOCAINE HCL (CARDIAC) 20 MG/ML IV SOLN
INTRAVENOUS | Status: AC
  Filled 2013-08-26: qty 5

## 2013-08-26 MED ORDER — OXYCODONE HCL 5 MG PO TABS
5.0000 mg | ORAL_TABLET | ORAL | Status: DC | PRN
  Administered 2013-08-26 (×2): 5 mg via ORAL
  Filled 2013-08-26 (×2): qty 1

## 2013-08-26 MED ORDER — FENTANYL CITRATE 0.05 MG/ML IJ SOLN
INTRAMUSCULAR | Status: DC | PRN
  Administered 2013-08-26: 100 ug via INTRAVENOUS
  Administered 2013-08-26 (×2): 50 ug via INTRAVENOUS
  Administered 2013-08-26: 100 ug via INTRAVENOUS
  Administered 2013-08-26: 50 ug via INTRAVENOUS

## 2013-08-26 MED ORDER — INSULIN ASPART 100 UNIT/ML ~~LOC~~ SOLN
SUBCUTANEOUS | Status: DC | PRN
  Administered 2013-08-26 (×2): 4 [IU] via SUBCUTANEOUS

## 2013-08-26 MED ORDER — ROCURONIUM BROMIDE 100 MG/10ML IV SOLN
INTRAVENOUS | Status: AC
  Filled 2013-08-26: qty 1

## 2013-08-26 MED ORDER — LACTATED RINGERS IV SOLN
INTRAVENOUS | Status: DC | PRN
  Administered 2013-08-26: 1000 mL via INTRAVENOUS

## 2013-08-26 MED ORDER — LIDOCAINE HCL (PF) 2 % IJ SOLN
INTRAMUSCULAR | Status: DC | PRN
  Administered 2013-08-26: 75 mg via INTRADERMAL

## 2013-08-26 MED ORDER — LACTATED RINGERS IV SOLN
INTRAVENOUS | Status: DC | PRN
  Administered 2013-08-26: 07:00:00 via INTRAVENOUS

## 2013-08-26 MED ORDER — MORPHINE SULFATE 10 MG/ML IJ SOLN: 2.0000 mg | INTRAMUSCULAR | Status: DC | PRN

## 2013-08-26 MED ORDER — ROCURONIUM BROMIDE 100 MG/10ML IV SOLN
INTRAVENOUS | Status: DC | PRN
  Administered 2013-08-26: 60 mg via INTRAVENOUS
  Administered 2013-08-26: 10 mg via INTRAVENOUS
  Administered 2013-08-26 (×3): 20 mg via INTRAVENOUS

## 2013-08-26 MED ORDER — NEOSTIGMINE METHYLSULFATE 1 MG/ML IJ SOLN
INTRAMUSCULAR | Status: DC | PRN
  Administered 2013-08-26: 4 mg via INTRAVENOUS

## 2013-08-26 MED ORDER — ACETAMINOPHEN 650 MG RE SUPP
650.0000 mg | RECTAL | Status: DC | PRN
  Filled 2013-08-26: qty 1

## 2013-08-26 MED ORDER — ONDANSETRON HCL 4 MG/2ML IJ SOLN
INTRAMUSCULAR | Status: AC
  Filled 2013-08-26: qty 2

## 2013-08-26 MED ORDER — FENTANYL CITRATE 0.05 MG/ML IJ SOLN
INTRAMUSCULAR | Status: AC
  Filled 2013-08-26: qty 5

## 2013-08-26 MED ORDER — SODIUM CHLORIDE 0.9 % IJ SOLN
INTRAMUSCULAR | Status: AC
  Filled 2013-08-26: qty 10

## 2013-08-26 MED ORDER — ACETAMINOPHEN 325 MG PO TABS: 650.0000 mg | ORAL_TABLET | ORAL | Status: DC | PRN

## 2013-08-26 MED ORDER — ATROPINE SULFATE 0.4 MG/ML IJ SOLN
INTRAMUSCULAR | Status: AC
  Filled 2013-08-26: qty 1

## 2013-08-26 MED ORDER — ONDANSETRON HCL 4 MG/2ML IJ SOLN
INTRAMUSCULAR | Status: DC | PRN
  Administered 2013-08-26: 4 mg via INTRAVENOUS

## 2013-08-26 MED ORDER — CEFAZOLIN SODIUM-DEXTROSE 2-3 GM-% IV SOLR
INTRAVENOUS | Status: AC
  Filled 2013-08-26: qty 50

## 2013-08-26 SURGICAL SUPPLY — 40 items
APL SKNCLS STERI-STRIP NONHPOA (GAUZE/BANDAGES/DRESSINGS) ×1
APPLIER CLIP 5 13 M/L LIGAMAX5 (MISCELLANEOUS) ×3
APR CLP MED LRG 5 ANG JAW (MISCELLANEOUS) ×1
BENZOIN TINCTURE PRP APPL 2/3 (GAUZE/BANDAGES/DRESSINGS) ×3 IMPLANT
CABLE HIGH FREQUENCY MONO STRZ (ELECTRODE) ×2 IMPLANT
CHLORAPREP W/TINT 26ML (MISCELLANEOUS) ×3 IMPLANT
CLIP APPLIE 5 13 M/L LIGAMAX5 (MISCELLANEOUS) IMPLANT
CLOSURE WOUND 1/2 X4 (GAUZE/BANDAGES/DRESSINGS) ×1
DECANTER SPIKE VIAL GLASS SM (MISCELLANEOUS) ×3 IMPLANT
DISSECT BALLN SPACEMKR + OVL (BALLOONS) ×3
DISSECTOR BALLN SPACEMKR + OVL (BALLOONS) ×1 IMPLANT
DISSECTOR BLUNT TIP ENDO 5MM (MISCELLANEOUS) ×3 IMPLANT
DRAPE LAPAROSCOPIC ABDOMINAL (DRAPES) ×3 IMPLANT
DRAPE UTILITY XL STRL (DRAPES) ×3 IMPLANT
DRAPE WARM FLUID 44X44 (DRAPE) ×2 IMPLANT
DRSG TEGADERM 2-3/8X2-3/4 SM (GAUZE/BANDAGES/DRESSINGS) ×8 IMPLANT
ELECT REM PT RETURN 9FT ADLT (ELECTROSURGICAL) ×3
ELECTRODE REM PT RTRN 9FT ADLT (ELECTROSURGICAL) ×1 IMPLANT
GLOVE BIOGEL PI IND STRL 7.0 (GLOVE) ×1 IMPLANT
GLOVE BIOGEL PI INDICATOR 7.0 (GLOVE) ×2
GLOVE ECLIPSE 8.0 STRL XLNG CF (GLOVE) ×3 IMPLANT
GLOVE INDICATOR 8.0 STRL GRN (GLOVE) ×6 IMPLANT
GOWN STRL REUS W/TWL LRG LVL3 (GOWN DISPOSABLE) ×3 IMPLANT
GOWN STRL REUS W/TWL XL LVL3 (GOWN DISPOSABLE) ×6 IMPLANT
KIT BASIN OR (CUSTOM PROCEDURE TRAY) ×3 IMPLANT
MARKER SKIN DUAL TIP RULER LAB (MISCELLANEOUS) ×1 IMPLANT
MESH HERNIA 6X6 BARD (Mesh General) IMPLANT
MESH HERNIA BARD 6X6 (Mesh General) ×4 IMPLANT
NDL INSUFFLATION 14GA 120MM (NEEDLE) IMPLANT
NEEDLE INSUFFLATION 14GA 120MM (NEEDLE) IMPLANT
SCISSORS LAP 5X35 DISP (ENDOMECHANICALS) ×2 IMPLANT
SET IRRIG TUBING LAPAROSCOPIC (IRRIGATION / IRRIGATOR) ×2 IMPLANT
SOLUTION ANTI FOG 6CC (MISCELLANEOUS) ×3 IMPLANT
STRIP CLOSURE SKIN 1/2X4 (GAUZE/BANDAGES/DRESSINGS) ×2 IMPLANT
SUT MNCRL AB 4-0 PS2 18 (SUTURE) ×3 IMPLANT
TACKER 5MM HERNIA 3.5CML NAB (ENDOMECHANICALS) ×2 IMPLANT
TOWEL OR 17X26 10 PK STRL BLUE (TOWEL DISPOSABLE) ×3 IMPLANT
TRAY LAP CHOLE (CUSTOM PROCEDURE TRAY) ×3 IMPLANT
TROCAR CANNULA W/PORT DUAL 5MM (MISCELLANEOUS) ×4 IMPLANT
TUBING INSUFFLATION 10FT LAP (TUBING) ×3 IMPLANT

## 2013-08-26 NOTE — Transfer of Care (Signed)
Immediate Anesthesia Transfer of Care Note  Patient: Logan Pitts  Procedure(s) Performed: Procedure(s): LAPAROSCOPIC INGUINAL HERNIA BILATERAL WITH MESH (Bilateral) INSERTION OF MESH (Bilateral)  Patient Location: PACU  Anesthesia Type:General  Level of Consciousness: awake, alert , oriented and patient cooperative  Airway & Oxygen Therapy: Patient Spontanous Breathing and Patient connected to face mask oxygen  Post-op Assessment: Report given to PACU RN, Post -op Vital signs reviewed and stable and Patient moving all extremities X 4  Post vital signs: Reviewed and stable  Complications: No apparent anesthesia complications

## 2013-08-26 NOTE — Anesthesia Postprocedure Evaluation (Signed)
  Anesthesia Post-op Note  Patient: Logan Pitts  Procedure(s) Performed: Procedure(s) (LRB): LAPAROSCOPIC INGUINAL HERNIA BILATERAL WITH MESH (Bilateral) INSERTION OF MESH (Bilateral)  Patient Location: PACU  Anesthesia Type: General  Level of Consciousness: awake and alert   Airway and Oxygen Therapy: Patient Spontanous Breathing  Post-op Pain: mild  Post-op Assessment: Post-op Vital signs reviewed, Patient's Cardiovascular Status Stable, Respiratory Function Stable, Patent Airway and No signs of Nausea or vomiting  Last Vitals:  Filed Vitals:   08/26/13 1144  BP: 118/75  Pulse: 72  Temp: 36.3 C  Resp: 12    Post-op Vital Signs: stable   Complications: No apparent anesthesia complications

## 2013-08-26 NOTE — Discharge Instructions (Signed)
[facility] _______[facility]   INGUINAL HERNIA REPAIR: POST OP INSTRUCTIONS  Always review your discharge instruction sheet given to you by the facility where your surgery was performed. IF YOU HAVE DISABILITY OR FAMILY LEAVE FORMS, YOU MUST BRING THEM TO THE OFFICE FOR PROCESSING.   DO NOT GIVE THEM TO YOUR DOCTOR.  1. A  prescription for pain medication may be given to you upon discharge.  Take your pain medication as prescribed, if needed.  If narcotic pain medicine is not needed, then you may take acetaminophen (Tylenol) or ibuprofen (Advil) as needed. 2. Take your usually prescribed medications unless otherwise directed. 3. If you need a refill on your pain medication, please contact your pharmacy.  They will contact our office to request authorization. Prescriptions will not be filled after 5 pm or on week-ends. 4. You should follow a light diet the first 24 hours after arrival home, such as soup and crackers, etc.  Be sure to include lots of fluids daily.  Resume your normal diet the day after surgery. 5. Most patients will experience some swelling and bruising around the umbilicus or in the groin and scrotum.  Ice packs and reclining will help.  Swelling and bruising can take several weeks to resolve.  6. It is common to experience some constipation if taking pain medication after surgery.  Increasing fluid intake and taking a stool softener (such as Colace) will usually help or prevent this problem from occurring.  A mild laxative (Milk of Magnesia or Miralax) should be taken according to package directions if there are no bowel movements after 48 hours. 7. Unless discharge instructions indicate otherwise, you may remove your bandages 72 hours after surgery, and you may shower at that time.  You may have steri-strips (small skin tapes) in place directly over the incision.  These strips should be left on the skin for 14 days.  If your surgeon used skin glue on the incision, you may  shower in 24 hours.  The glue will flake off over the next 2-3 weeks.  Any sutures or staples will be removed at the office during your follow-up visit. 8. ACTIVITIES:  You may resume regular (light) daily activities beginning the next day--such as daily self-care, walking, climbing stairs--gradually increasing activities as tolerated.  You may have sexual intercourse when it is comfortable.  Refrain from any heavy lifting or straining-nothing over 10 pounds for 6 weeks.  a. You may drive when you are no longer taking prescription pain medication, you can comfortably wear a seatbelt, and you can safely maneuver your car and apply brakes. b. RETURN TO WORK:  __________________________________________________________ 9. You should see your doctor in the office for a follow-up appointment approximately 2-3 weeks after your surgery.  Make sure that you call for this appointment within a day or two after you arrive home to insure a convenient appointment time. 10. OTHER INSTRUCTIONS:  __Resume Aspirin on 2/13/15________________________________________________________________________________________________________________________________________________________________________________________  WHEN TO CALL YOUR DOCTOR: 1. Fever over 101.0 2. Inability to urinate 3. Nausea and/or vomiting 4. Extreme swelling or bruising 5. Continued bleeding from incision. 6. Increased pain, redness, or drainage from the incision  The clinic staff is available to answer your questions during regular business hours.  Please dont hesitate to call and ask to speak to one of the nurses for clinical concerns.  If you have a medical emergency, go to the nearest emergency room or call 911.  A surgeon from [facility] is always on call at  the hospital   [address] [place], [place], [place]  [address] ?  [address], [place], [place]   [address] [phone] ? [phone] ? FAX [phone] Web  site: [email or web address]

## 2013-08-26 NOTE — H&P (Addendum)
Logan Pitts is an 68 y.o. male.   Chief Complaint:    Here for elective surgery. HPI:   He has long-standing bilateral inguinal hernia repairs. Left side is symptomatic. He now presents for repair.  Past Medical History  Diagnosis Date  . Allergy   . Asthma   . Diabetes mellitus without complication   . Hypertension   . DDD (degenerative disc disease), cervical   . GERD (gastroesophageal reflux disease)   . Hypothyroidism   . 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 Sleep apnea     USES C PAP   . Hernia, inguinal, bilateral   . Rash     ACROSS CHEST    Past Surgical History  Procedure Laterality Date  . Nasal sinus surgery    . Shoulder joint surgery    . Vasectomy      Family History  Problem Relation Age of Onset  . Cancer Mother   . Heart disease Father   . Cancer Sister    Social History:  reports that he quit smoking about 35 years ago. He does not have any smokeless tobacco history on file. He reports that he drinks alcohol. He reports that he does not use illicit drugs.  Allergies: No Known Allergies  Medications Prior to Admission  Medication Sig Dispense Refill  . albuterol (PROVENTIL HFA;VENTOLIN HFA) 108 (90 BASE) MCG/ACT inhaler Inhale 2 puffs into the lungs every 6 (six) hours as needed for wheezing.      Marland Kitchen amLODipine (NORVASC) 10 MG tablet Take 10 mg by mouth every morning.       Marland Kitchen atorvastatin (LIPITOR) 80 MG tablet Take 80 mg by mouth every morning.       . budesonide-formoterol (SYMBICORT) 160-4.5 MCG/ACT inhaler Inhale 2 puffs into the lungs every evening.      . ezetimibe (ZETIA) 10 MG tablet Take 10 mg by mouth every morning.       . flunisolide (NASALIDE) 25 MCG/ACT (0.025%) SOLN Inhale 2 sprays into the lungs every evening.       Marland Kitchen guaifenesin (HUMIBID E) 400 MG TABS tablet Take 400 mg by mouth every 4 (four) hours.      . insulin aspart protamine- aspart (NOVOLOG 70/30) (70-30) 100 UNIT/ML injection Inject 5-15 Units into the skin 3  (three) times daily with meals.       . insulin glargine (LANTUS) 100 UNIT/ML injection Inject 35 Units into the skin at bedtime.       Marland Kitchen levothyroxine (SYNTHROID, LEVOTHROID) 200 MCG tablet Take 200 mcg by mouth daily before breakfast.      . lisinopril (PRINIVIL,ZESTRIL) 40 MG tablet Take 40 mg by mouth every morning.       Marland Kitchen omeprazole (PRILOSEC) 20 MG capsule Take 20 mg by mouth daily.      . pseudoephedrine (SUDAFED) 30 MG tablet Take 30 mg by mouth every 4 (four) hours as needed for congestion.      . vardenafil (LEVITRA) 20 MG tablet Take 20 mg by mouth daily as needed for erectile dysfunction.      Marland Kitchen aspirin 81 MG tablet Take 81 mg by mouth daily.      . meloxicam (MOBIC) 15 MG tablet Take 15 mg by mouth daily.         Results for orders placed during the hospital encounter of 08/26/13 (from the past 48 hour(s))  GLUCOSE, CAPILLARY     Status: Abnormal   Collection Time  08/26/13  6:00 AM      Result Value Range   Glucose-Capillary 179 (*) 70 - 99 mg/dL   No results found.  Review of Systems  Constitutional: Negative for fever and chills.  HENT: Negative for congestion and sore throat.   Respiratory: Negative for cough.     Blood pressure 125/70, pulse 86, temperature 98.6 F (37 C), temperature source Oral, resp. rate 18, SpO2 97.00%. Physical Exam  Constitutional: He appears well-developed and well-nourished. No distress.  HENT:  Head: Normocephalic and atraumatic.  GI: Soft. There is no tenderness.  Tiny umbilical hernia  Genitourinary:  Reducible left inguinal bulge. Small right inguinal bulge.  Neurological: He is alert.  Skin: Skin is warm and dry.     Assessment/Plan Bilateral inguinal hernias.  Plan: Laparoscopic bilateral inguinal hernia repair with mesh.  Mesh. The procedure, risks, and after care have been discussed with him.  Estalene Bergey J 08/26/2013, 7:25 AM

## 2013-08-26 NOTE — Interval H&P Note (Signed)
History and Physical Interval Note:  08/26/2013 7:32 AM  Logan Pitts  has presented today for surgery, with the diagnosis of bilateral inguinal hernias  The various methods of treatment have been discussed with the patient and family. After consideration of risks, benefits and other options for treatment, the patient has consented to  Procedure(s): LAPAROSCOPIC INGUINAL HERNIA (Bilateral) INSERTION OF MESH (N/A) as a surgical intervention .  The patient's history has been reviewed, patient examined, no change in status, stable for surgery.  I have reviewed the patient's chart and labs.  Questions were answered to the patient's satisfaction.     Logan Pitts

## 2013-08-26 NOTE — Op Note (Signed)
Operative Note  Logan Pitts male 68 y.o. 08/26/2013  PREOPERATIVE DX:  Bilateral inguinal hernias  POSTOPERATIVE DX:  Same  PROCEDURE:  Laparoscopic repair of bilateral inguinal hernias with mesh         Surgeon: Odis Hollingshead   Assistants: None  Anesthesia: General endotracheal anesthesia  Indications:   This is a 68 year old male with long-standing bilateral hernias. Left-sided becoming increasingly symptomatic. He now presents for repair.    Procedure Detail:  He was seen in the holding area. He voided. He is brought to the operating room placed upon the operating table and general anesthetic was given. Tear of the lower bowel wall and groin was clipped. The area was then sterilely prepped and draped.  Marcaine was infiltrated in the subumbilical region. A small transverse subumbilical incision was made through the skin and subcutaneous tissue. The left anterior rectus sheath was identified and a small incision made in the. The underlying left rectus muscle was swept laterally exposing the posterior rectus sheath. The balloon dissection device was then placed into the extradural space. Blunt dissection was performed in the lower midline extraperitoneal space.  The balloon was then removed and CO2 gas was insufflated creating a working space. Two 5 mm trocars were then placed in the lower midline under direct vision.  Using blunt dissection symphysis pubis was identified and I dissected tissue free from the Cooper's ligament bilaterally exposing his. The left inferior epigastric vessels were dangling anteriorly in the plane of the field so these were clipped and divided on the left side.  This provided for significantly better visualization. Using blunt dissection I dissected the tissue free from the abdominal wall on the left side to the level of the umbilicus. I then identified a very large indirect hernia with an adherent sac that went up into a patulous internal ring. This  took some time but I was able to carefully reduce this. I identified all the spermatic cord structures. The sac was reduced back to the level of the umbilicus.  No direct hernia was noted.  I then approached the right side. I dissected the tissue free from the anterior and lateral abdominal walls on the right side in the lower midline area. Identified the spermatic cord and made a window around it. The indirect hernia sac was noted was that going up to a patulous internal ring as well. This was all reduced. I reduced the sac back to the level of the umbilicus.Then brought a piece of polypropylene mesh measuring 15 cm x 15 cm into the field. I cut 2 cm off of it. A partial longitudinal slit was cut into it . It was placed into the right extraperitoneal space and deployed so that the 2 tails were wrapped around the spermatic cord. The mesh was then anchored to Cooper's ligament, the anterior abdominal wall, and lateral abdominal wall with spiral tacks. This provided for adequate coverage a good overlap of the direct indirect and femoral spaces.  Likewise a similar sized piece of mesh was used for the left extraperitoneal space and I cut it similarly. It was placed in the left extraperitoneal space. It was deployed so that the 2 tails wrapped around the spermatic cord. I then anchored the mesh to Cooper's ligament, the anterior abdominal wall, and the lateral pelvic wall with spiral tacks. This provided for good coverage and good overlap of the direct, indirect, and femoral spaces.  The area was inspected and hemostasis was adequate. The mesh was still done bilaterally  the CO2 gas release. All instruments and trochars were then removed.  The left anterior rectus sheath defect was then closed with interrupted 0 Vicryl sutures. The skin was closed with 4-0 Monocryl subcuticular stitches. Steri-Strips and sterile dressings were applied.  He tolerated the procedure well without any apparent complications and was  taken to the recovery room in satisfactory condition.      Findings: Bilateral indirect hernias  Estimated Blood Loss:  less than 100 mL         Drains: none  Blood Given: none          Specimens: none        Complications:  * No complications entered in OR log *         Disposition: PACU - hemodynamically stable.         Condition: stable

## 2013-08-26 NOTE — Progress Notes (Signed)
Spoke to [place] via phone and informed him that pt has been unable to void post-op.  Bladder scan performed showed >981ml.  New orders received by MD, 2 options given and MD states it is pt choice.  I&O catheter and pt can go home or Place foley catheter with leg bag and pt goes home with catheter and will d/c the catheter at home himself.  MD states to give pt choice and then he can go home.

## 2013-08-26 NOTE — Progress Notes (Signed)
Gave pt options for catheter d/t his inability to void post-op.  Pt requests to try and void one more time, this time sitting down instead of standing.  Assisted to bathroom.  After about 5 minutes pt voided large amount of urine.  Pt states he feels so much better.  Catheter did not have to be placed.  Pt d/c home at this time in stable condition.

## 2013-08-27 ENCOUNTER — Encounter (HOSPITAL_COMMUNITY): Payer: Self-pay | Admitting: General Surgery

## 2013-08-29 ENCOUNTER — Telehealth (INDEPENDENT_AMBULATORY_CARE_PROVIDER_SITE_OTHER): Payer: Self-pay

## 2013-08-29 NOTE — Telephone Encounter (Signed)
If he has persistent problems over the weekend, I would recommend him trying Dulcolax suppositories or a fleets enema.

## 2013-08-29 NOTE — Telephone Encounter (Signed)
Pt called stating he had hernia repair Tuesday and is constipated. Pt states he has been on stool softeners. He tried MOM yesterday. Pt advised to D/C narcotics when he can comfortably do so. Pt advised to increase water and start Miralax per protocol. Pt to continue Miralax until BMs become regular and more normal. Pt will call if constipation does not resolve. Pt states he understands.

## 2013-09-03 NOTE — Telephone Encounter (Signed)
Pt reports today that the MOM worked and he is having regular BM's.  He mentioned he can feel a gentle "pulling" sensation in his inguinal area when he coughs.  It is not painful.  I told him this was the mesh pulling a little and that it was normal.  It can take up to 6 weeks for the mesh to fully attach to tissue.  Pt voiced understanding.

## 2013-09-10 ENCOUNTER — Encounter (INDEPENDENT_AMBULATORY_CARE_PROVIDER_SITE_OTHER): Payer: Self-pay | Admitting: General Surgery

## 2013-09-10 ENCOUNTER — Ambulatory Visit (INDEPENDENT_AMBULATORY_CARE_PROVIDER_SITE_OTHER): Payer: Medicare Other | Admitting: General Surgery

## 2013-09-10 VITALS — BP 112/64 | HR 76 | Temp 98.3°F | Resp 12 | Ht 67.5 in | Wt 181.6 lb

## 2013-09-10 DIAGNOSIS — Z4889 Encounter for other specified surgical aftercare: Secondary | ICD-10-CM

## 2013-09-10 NOTE — Progress Notes (Signed)
He presents for postop followup after laparoscopic bilateral inguinal hernia repair with mesh.  Post op pain is improving.  No difficulty voiding or having BMs.  Swelling is decreasing.  He has had a little drainage from the inferior incision.  P.E.  ABD:  Soft, incisions clean, Inferior incision is slightly hyperemic  GU: Minimal left inguinal swelling, no right inguinal swelling.  Assessment:  Doing well post hernia repair.  Inferior incision and slightly hyperemic due to clothes rubbing on it.  Plan:  Continue light activities for 6 weeks postop then slowly start to resume normal activities.  Keep in inferior incision covered for 2 weeks with a bandage. Return visit as needed.

## 2013-09-10 NOTE — Patient Instructions (Signed)
Light activities for now. Resume normal activities as tolerated 4 weeks from now as we discussed.

## 2013-10-06 ENCOUNTER — Telehealth (INDEPENDENT_AMBULATORY_CARE_PROVIDER_SITE_OTHER): Payer: Self-pay | Admitting: General Surgery

## 2013-10-06 NOTE — Telephone Encounter (Signed)
Pt called to validate his interpretation of "watchful waiting" as it applies to his hernia.  Apparently he has a small incisional hernia, that is so far not symptomatic.  He understands that he is to watch that area and call if it becomes painful, larger or otherwise worrisome.  Agreed that this is a correct interpretation.

## 2013-10-31 ENCOUNTER — Telehealth (INDEPENDENT_AMBULATORY_CARE_PROVIDER_SITE_OTHER): Payer: Self-pay | Admitting: General Surgery

## 2013-10-31 NOTE — Telephone Encounter (Signed)
Patient called in due to having some "tightness" at the incision site of his inguinal hernia repair from 08/26/13.  He explains that he wasn't having any problems after the surgery but now he will get sharp pains and tightness at one of the incision sites.  He has no redness, swelling, or problems at the old incision site.  I informed him that it is probably from where the mesh is being held in place.  Informed him that it is normal to have some of these pains but that they should dissipate with time.  Informed him to keep [place] up to date with this problem and have him call us back if things seem to get worse for him.

## 2013-10-31 NOTE — Telephone Encounter (Signed)
Agree 

## 2013-11-20 ENCOUNTER — Encounter (INDEPENDENT_AMBULATORY_CARE_PROVIDER_SITE_OTHER): Payer: Self-pay | Admitting: General Surgery

## 2013-11-20 ENCOUNTER — Ambulatory Visit (INDEPENDENT_AMBULATORY_CARE_PROVIDER_SITE_OTHER): Payer: Medicare Other | Admitting: General Surgery

## 2013-11-20 VITALS — BP 120/78 | HR 78 | Temp 98.7°F | Resp 14 | Wt 182.2 lb

## 2013-11-20 DIAGNOSIS — IMO0002 Reserved for concepts with insufficient information to code with codable children: Secondary | ICD-10-CM

## 2013-11-20 DIAGNOSIS — S76219A Strain of adductor muscle, fascia and tendon of unspecified thigh, initial encounter: Secondary | ICD-10-CM | POA: Insufficient documentation

## 2013-11-20 NOTE — Progress Notes (Signed)
Subjective:     Patient ID: Logan Pitts, male   DOB: 1945-11-12, 68 y.o.   MRN: [id number]  HPIHe is here because she's been having increasing left groin pain for the past 3-4 weeks. He did well after his laparoscopic bilateral inguinal hernia repair and resumed his normal activities. 3-4 weeks ago, he was having significant sneezing episodes because of allergies. Also he is begun doing a lot of outdoor work. The pain has progressively worsene, the more active he has become, down in the left groin area. It is for this reason he comes to see [place] today.   Review of SystemsNo difficulty with bowel movements or urination.     Objective:   Physical Exam Gen.-overweight male in no acute distress.  Abdomen-protuberant, incisions are clean and intact in the subumbilical area.  GU-left inguinal hernia. Solid. Mild discomfort in the upper groin.    Assessment:     Acute left groin strain. May be where the mesh has pulled away from one of its fixation points. No evidence of recurrent hernia.     Plan:     Back to light activity for 6 weeks. Ice for the next 48 hours and he. Ibuprofen for pain. I have asked him to call back if it is no better within 6 weeks.

## 2013-11-20 NOTE — Patient Instructions (Signed)
Apply ice to the area for the next 48 hours. Use ibuprofen as we discussed for pain/discomfort. Avoid lifting over 20 pounds for the next 6 weeks. Light activities for 6 weeks. If it is not better at that time, please call

## 2014-04-01 ENCOUNTER — Ambulatory Visit (INDEPENDENT_AMBULATORY_CARE_PROVIDER_SITE_OTHER): Payer: Medicare Other | Admitting: Family Medicine

## 2014-04-01 ENCOUNTER — Ambulatory Visit (INDEPENDENT_AMBULATORY_CARE_PROVIDER_SITE_OTHER): Payer: Medicare Other

## 2014-04-01 VITALS — BP 110/62 | HR 97 | Temp 97.8°F | Resp 16 | Ht 66.0 in | Wt 180.0 lb

## 2014-04-01 DIAGNOSIS — IMO0001 Reserved for inherently not codable concepts without codable children: Secondary | ICD-10-CM | POA: Diagnosis not present

## 2014-04-01 DIAGNOSIS — IMO0002 Reserved for concepts with insufficient information to code with codable children: Secondary | ICD-10-CM

## 2014-04-01 DIAGNOSIS — R509 Fever, unspecified: Secondary | ICD-10-CM

## 2014-04-01 DIAGNOSIS — E1165 Type 2 diabetes mellitus with hyperglycemia: Secondary | ICD-10-CM

## 2014-04-01 LAB — COMPREHENSIVE METABOLIC PANEL WITH GFR
ALT: 83 U/L — ABNORMAL HIGH (ref 0–53)
AST: 70 U/L — ABNORMAL HIGH (ref 0–37)
Albumin: 4.2 g/dL (ref 3.5–5.2)
Alkaline Phosphatase: 130 U/L — ABNORMAL HIGH (ref 39–117)
BUN: 15 mg/dL (ref 6–23)
CO2: 23 meq/L (ref 19–32)
Calcium: 8.6 mg/dL (ref 8.4–10.5)
Chloride: 99 meq/L (ref 96–112)
Creat: 1.04 mg/dL (ref 0.50–1.35)
Glucose, Bld: 252 mg/dL — ABNORMAL HIGH (ref 70–99)
Potassium: 3.8 meq/L (ref 3.5–5.3)
Sodium: 132 meq/L — ABNORMAL LOW (ref 135–145)
Total Bilirubin: 1.8 mg/dL — ABNORMAL HIGH (ref 0.2–1.2)
Total Protein: 6.4 g/dL (ref 6.0–8.3)

## 2014-04-01 LAB — POCT INFLUENZA A/B
Influenza A, POC: NEGATIVE
Influenza B, POC: NEGATIVE

## 2014-04-01 LAB — POCT UA - MICROSCOPIC ONLY
Bacteria, U Microscopic: NEGATIVE
Casts, Ur, LPF, POC: NEGATIVE
Crystals, Ur, HPF, POC: NEGATIVE
Mucus, UA: NEGATIVE
RBC, urine, microscopic: NEGATIVE
Yeast, UA: NEGATIVE

## 2014-04-01 LAB — POCT CBC
Granulocyte percent: 72.5 %G (ref 37–80)
HCT, POC: 41 % — AB (ref 43.5–53.7)
Hemoglobin: 13.9 g/dL — AB (ref 14.1–18.1)
Lymph, poc: 0.7 (ref 0.6–3.4)
MCH, POC: 31.4 pg — AB (ref 27–31.2)
MCHC: 33.9 g/dL (ref 31.8–35.4)
MCV: 92.6 fL (ref 80–97)
MID (cbc): 0.1 (ref 0–0.9)
MPV: 6.9 fL (ref 0–99.8)
POC Granulocyte: 2.2 (ref 2–6.9)
POC LYMPH PERCENT: 23.3 %L (ref 10–50)
POC MID %: 4.2 %M (ref 0–12)
Platelet Count, POC: 93 10*3/uL — AB (ref 142–424)
RBC: 4.43 M/uL — AB (ref 4.69–6.13)
RDW, POC: 13.6 %
WBC: 3.1 10*3/uL — AB (ref 4.6–10.2)

## 2014-04-01 LAB — POCT URINALYSIS DIPSTICK
Bilirubin, UA: NEGATIVE
Blood, UA: NEGATIVE
Glucose, UA: >=1000
Ketones, UA: 40
Leukocytes, UA: NEGATIVE
Nitrite, UA: NEGATIVE
Protein, UA: 30
Spec Grav, UA: 1.015
Urobilinogen, UA: 1
pH, UA: 5.5

## 2014-04-01 LAB — POCT GLYCOSYLATED HEMOGLOBIN (HGB A1C): Hemoglobin A1C: 7.4

## 2014-04-01 LAB — GLUCOSE, POCT (MANUAL RESULT ENTRY): POC Glucose: 263 mg/dL — AB (ref 70–99)

## 2014-04-01 MED ORDER — CANAGLIFLOZIN 100 MG PO TABS: 100.0000 mg | ORAL_TABLET | Freq: Every morning | ORAL | Status: DC

## 2014-04-01 NOTE — Patient Instructions (Signed)

## 2014-04-01 NOTE — Progress Notes (Signed)
Is a 67 year old gentleman with 2 days of chills and fever. He also has diabetes which is spiked high glucoses over 350. He's increased his NovoLog insulin to try to match these diabetic cheeks with very limited success.  Patient has a cough and sore throat. He has slight nausea but no vomiting. He has no dysuria. No known tick bite or rash  Objective: No acute distress HEENT: Mild erythema of the throat Neck: Supple no adenopathy Chest: Few very basilar rales Heart: Regular, no murmur or gallop Abdomen: Soft nontender without HSM Extremities: No edema or rash  Results for orders placed in visit on 04/01/14  POCT CBC      Result Value Ref Range   WBC 3.1 (*) 4.6 - 10.2 K/uL   Lymph, poc 0.7  0.6 - 3.4   POC LYMPH PERCENT 23.3  10 - 50 %L   MID (cbc) 0.1  0 - 0.9   POC MID % 4.2  0 - 12 %M   POC Granulocyte 2.2  2 - 6.9   Granulocyte percent 72.5  37 - 80 %G   RBC 4.43 (*) 4.69 - 6.13 M/uL   Hemoglobin 13.9 (*) 14.1 - 18.1 g/dL   HCT, POC 41.0 (*) 43.5 - 53.7 %   MCV 92.6  80 - 97 fL   MCH, POC 31.4 (*) 27 - 31.2 pg   MCHC 33.9  31.8 - 35.4 g/dL   RDW, POC 13.6     Platelet Count, POC 93 (*) 142 - 424 K/uL   MPV 6.9  0 - 99.8 fL  GLUCOSE, POCT (MANUAL RESULT ENTRY)      Result Value Ref Range   POC Glucose 263 (*) 70 - 99 mg/dl  POCT GLYCOSYLATED HEMOGLOBIN (HGB A1C)      Result Value Ref Range   Hemoglobin A1C 7.4    POCT UA - MICROSCOPIC ONLY      Result Value Ref Range   WBC, Ur, HPF, POC 0-2     RBC, urine, microscopic neg     Bacteria, U Microscopic neg     Mucus, UA neg     Epithelial cells, urine per micros 0-1     Crystals, Ur, HPF, POC neg     Casts, Ur, LPF, POC neg     Yeast, UA neg    POCT URINALYSIS DIPSTICK      Result Value Ref Range   Color, UA yellow     Clarity, UA clear     Glucose, UA >=1000     Bilirubin, UA neg     Ketones, UA 40     Spec Grav, UA 1.015     Blood, UA neg     pH, UA 5.5     Protein, UA 30     Urobilinogen, UA 1.0     Nitrite, UA neg     Leukocytes, UA Negative    [facility] reading (PRIMARY) by  Dr. Joseph Art:  CXR-->blunted diaphragms, otherwise clear.  Negative flu test  Assessment: From the white count this appears to be a viral infection.  Most concerning is the high blood sugar.  Fever, unspecified fever cause - Plan: DG Chest 2 View, POCT CBC, POCT glucose (manual entry), POCT glycosylated hemoglobin (Hb A1C), Comprehensive metabolic panel, POCT UA - Microscopic Only, POCT urinalysis dipstick, POCT Influenza A/B  Uncontrolled diabetes mellitus - Plan: POCT glucose (manual entry), POCT glycosylated hemoglobin (Hb A1C), Comprehensive metabolic panel, POCT UA - Microscopic Only, POCT urinalysis dipstick  Signed,  Robyn Haber, MD

## 2014-04-23 DIAGNOSIS — L821 Other seborrheic keratosis: Secondary | ICD-10-CM | POA: Diagnosis not present

## 2014-04-23 DIAGNOSIS — L570 Actinic keratosis: Secondary | ICD-10-CM | POA: Diagnosis not present

## 2014-04-23 DIAGNOSIS — Z86018 Personal history of other benign neoplasm: Secondary | ICD-10-CM | POA: Diagnosis not present

## 2014-04-23 DIAGNOSIS — Z808 Family history of malignant neoplasm of other organs or systems: Secondary | ICD-10-CM | POA: Diagnosis not present

## 2014-04-23 DIAGNOSIS — D229 Melanocytic nevi, unspecified: Secondary | ICD-10-CM | POA: Diagnosis not present

## 2014-07-17 ENCOUNTER — Emergency Department (HOSPITAL_COMMUNITY)
Admission: EM | Admit: 2014-07-17 | Discharge: 2014-07-17 | Disposition: A | Discharge status: Home / Self Care | Payer: Medicare Other | Attending: Emergency Medicine | Admitting: Emergency Medicine

## 2014-07-17 ENCOUNTER — Encounter (HOSPITAL_COMMUNITY): Payer: Self-pay | Admitting: Emergency Medicine

## 2014-07-17 DIAGNOSIS — Z791 Long term (current) use of non-steroidal anti-inflammatories (NSAID): Secondary | ICD-10-CM | POA: Insufficient documentation

## 2014-07-17 DIAGNOSIS — I10 Essential (primary) hypertension: Secondary | ICD-10-CM | POA: Diagnosis not present

## 2014-07-17 DIAGNOSIS — E039 Hypothyroidism, unspecified: Secondary | ICD-10-CM | POA: Insufficient documentation

## 2014-07-17 DIAGNOSIS — Z9981 Dependence on supplemental oxygen: Secondary | ICD-10-CM | POA: Diagnosis not present

## 2014-07-17 DIAGNOSIS — E119 Type 2 diabetes mellitus without complications: Secondary | ICD-10-CM | POA: Insufficient documentation

## 2014-07-17 DIAGNOSIS — K219 Gastro-esophageal reflux disease without esophagitis: Secondary | ICD-10-CM | POA: Diagnosis not present

## 2014-07-17 DIAGNOSIS — Z87891 Personal history of nicotine dependence: Secondary | ICD-10-CM | POA: Insufficient documentation

## 2014-07-17 DIAGNOSIS — J45909 Unspecified asthma, uncomplicated: Secondary | ICD-10-CM | POA: Diagnosis not present

## 2014-07-17 DIAGNOSIS — Z794 Long term (current) use of insulin: Secondary | ICD-10-CM | POA: Diagnosis not present

## 2014-07-17 DIAGNOSIS — J069 Acute upper respiratory infection, unspecified: Secondary | ICD-10-CM | POA: Diagnosis not present

## 2014-07-17 DIAGNOSIS — Z7982 Long term (current) use of aspirin: Secondary | ICD-10-CM | POA: Insufficient documentation

## 2014-07-17 DIAGNOSIS — G4730 Sleep apnea, unspecified: Secondary | ICD-10-CM | POA: Diagnosis not present

## 2014-07-17 DIAGNOSIS — H6691 Otitis media, unspecified, right ear: Secondary | ICD-10-CM | POA: Insufficient documentation

## 2014-07-17 DIAGNOSIS — H9201 Otalgia, right ear: Secondary | ICD-10-CM | POA: Diagnosis present

## 2014-07-17 DIAGNOSIS — M5030 Other cervical disc degeneration, unspecified cervical region: Secondary | ICD-10-CM | POA: Diagnosis not present

## 2014-07-17 DIAGNOSIS — Z7952 Long term (current) use of systemic steroids: Secondary | ICD-10-CM | POA: Diagnosis not present

## 2014-07-17 DIAGNOSIS — Z79899 Other long term (current) drug therapy: Secondary | ICD-10-CM | POA: Insufficient documentation

## 2014-07-17 LAB — CBG MONITORING, ED: Glucose-Capillary: 246 mg/dL — ABNORMAL HIGH (ref 70–99)

## 2014-07-17 MED ORDER — AMOXICILLIN-POT CLAVULANATE 500-125 MG PO TABS: 1.0000 | ORAL_TABLET | Freq: Three times a day (TID) | ORAL | Status: DC

## 2014-07-17 NOTE — Discharge Instructions (Signed)
Otitis Media °Otitis media is redness, soreness, and puffiness (swelling) in the space just behind your eardrum (middle ear). It may be caused by allergies or infection. It often happens along with a cold. °HOME CARE °· Take your medicine as told. Finish it even if you start to feel better. °· Only take over-the-counter or prescription medicines for pain, discomfort, or fever as told by your doctor. °· Follow up with your doctor as told. °GET HELP IF: °· You have otitis media only in one ear, or bleeding from your nose, or both. °· You notice a lump on your neck. °· You are not getting better in 3-5 days. °· You feel worse instead of better. °GET HELP RIGHT AWAY IF:  °· You have pain that is not helped with medicine. °· You have puffiness, redness, or pain around your ear. °· You get a stiff neck. °· You cannot move part of your face (paralysis). °· You notice that the bone behind your ear hurts when you touch it. °MAKE SURE YOU:  °· Understand these instructions. °· Will watch your condition. °· Will get help right away if you are not doing well or get worse. °Document Released: 12/20/2007 Document Revised: 07/08/2013 Document Reviewed: 01/28/2013 °[employer]® Patient Information ©2015 [employer]. This information is not intended to replace advice given to you by your health care provider. Make sure you discuss any questions you have with your health care provider. ° °

## 2014-07-17 NOTE — ED Notes (Addendum)
Pt states that he has been having cough, nasal congestion, rt otalgia, and sore throat x 1 wk.  States he has a hx of pna and wants to make sure it doesn't get that bad.  Pt states that when he gets sick, it throws his CBG off.

## 2014-07-17 NOTE — ED Provider Notes (Signed)
CSN: [id number]     Arrival date & time 07/17/14  9628 History   First MD Initiated Contact with Patient 07/17/14 [phone]     Chief Complaint  Patient presents with  . Cough  . Nasal Congestion  . Otalgia     HPI Pt states that he has been having cough, nasal congestion, rt otalgia, and sore throat x 1 wk. States he has a hx of pna and wants to make sure it doesn't get that bad. Pt states that when he gets sick, it throws his CBG off Past Medical History  Diagnosis Date  . Allergy   . Asthma   . Diabetes mellitus without complication   . Hypertension   . DDD (degenerative disc disease), cervical   . GERD (gastroesophageal reflux disease)   . Hypothyroidism   . 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 Sleep apnea     USES C PAP   . Hernia, inguinal, bilateral   . Rash     ACROSS CHEST   Past Surgical History  Procedure Laterality Date  . Nasal sinus surgery    . Shoulder joint surgery    . Vasectomy    . Inguinal hernia repair Bilateral 08/26/2013    Procedure: LAPAROSCOPIC INGUINAL HERNIA BILATERAL WITH MESH;  Surgeon: Odis Hollingshead, MD;  Location: [facility];  Service: General;  Laterality: Bilateral;  . Insertion of mesh Bilateral 08/26/2013    Procedure: INSERTION OF MESH;  Surgeon: Odis Hollingshead, MD;  Location: [facility];  Service: General;  Laterality: Bilateral;   Family History  Problem Relation Age of Onset  . Cancer Mother   . Heart disease Father   . Cancer Sister    History  Substance Use Topics  . Smoking status: Former Smoker    Quit date: 08/21/1978  . Smokeless tobacco: Not on file  . Alcohol Use: Yes     Comment: 4-5 DRINKS PER DAY    Review of Systems  All other systems reviewed and are negative  Allergies  Review of patient's allergies indicates no known allergies.  Home Medications   Prior to Admission medications   Medication Sig Start Date End Date Taking? Authorizing Provider  albuterol (PROVENTIL HFA;VENTOLIN HFA) 108 (90 BASE)  MCG/ACT inhaler Inhale 2 puffs into the lungs every 6 (six) hours as needed for wheezing.    Historical Provider, MD  amLODipine (NORVASC) 10 MG tablet Take 10 mg by mouth every morning.     Historical Provider, MD  amoxicillin-clavulanate (AUGMENTIN) 500-125 MG per tablet Take 1 tablet (500 mg total) by mouth every 8 (eight) hours. 07/17/14   Dot Lanes, MD  aspirin 81 MG tablet Take 81 mg by mouth daily.    Historical Provider, MD  atorvastatin (LIPITOR) 80 MG tablet Take 80 mg by mouth every morning.     Historical Provider, MD  budesonide-formoterol (SYMBICORT) 160-4.5 MCG/ACT inhaler Inhale 2 puffs into the lungs every evening.    Historical Provider, MD  Canagliflozin (INVOKANA) 100 MG TABS Take 1 tablet (100 mg total) by mouth every morning. 04/01/14   Robyn Haber, MD  ezetimibe (ZETIA) 10 MG tablet Take 10 mg by mouth every morning.     Historical Provider, MD  flunisolide (NASALIDE) 25 MCG/ACT (0.025%) SOLN Inhale 2 sprays into the lungs every evening.     Historical Provider, MD  guaifenesin (HUMIBID E) 400 MG TABS tablet Take 400 mg by mouth every 4 (four) hours.    Historical Provider,  MD  insulin aspart protamine- aspart (NOVOLOG 70/30) (70-30) 100 UNIT/ML injection Inject 5-15 Units into the skin 3 (three) times daily with meals.     Historical Provider, MD  insulin glargine (LANTUS) 100 UNIT/ML injection Inject 35 Units into the skin at bedtime.     Historical Provider, MD  levothyroxine (SYNTHROID, LEVOTHROID) 200 MCG tablet Take 200 mcg by mouth daily before breakfast.    Historical Provider, MD  lisinopril (PRINIVIL,ZESTRIL) 40 MG tablet Take 40 mg by mouth every morning.     Historical Provider, MD  meloxicam (MOBIC) 15 MG tablet Take 15 mg by mouth daily.  10/28/12   Historical Provider, MD  omeprazole (PRILOSEC) 20 MG capsule Take 20 mg by mouth daily.    Historical Provider, MD  oxyCODONE (OXY IR/ROXICODONE) 5 MG immediate release tablet Take 1-2 tablets (5-10 mg total) by  mouth every 4 (four) hours as needed. 08/26/13   Jackolyn Confer, MD  PRECISION XTRA TEST STRIPS test strip  09/07/13   Historical Provider, MD  pseudoephedrine (SUDAFED) 30 MG tablet Take 30 mg by mouth every 4 (four) hours as needed for congestion.    Historical Provider, MD  TECHLITE LANCETS [place]  07/08/13   Historical Provider, MD  vardenafil (LEVITRA) 20 MG tablet Take 20 mg by mouth daily as needed for erectile dysfunction.    Historical Provider, MD   BP 126/71 mmHg  Pulse 103  Temp(Src) 97.8 F (36.6 C) (Oral)  Resp 18  SpO2 98% Physical Exam  Constitutional: He is oriented to person, place, and time. He appears well-developed and well-nourished. No distress.  HENT:  Head: Normocephalic and atraumatic.  Right Ear: Tympanic membrane is erythematous.  Left Ear: Tympanic membrane is not erythematous.  Mouth/Throat: Uvula is midline and oropharynx is clear and moist. No oropharyngeal exudate or posterior oropharyngeal edema.  Eyes: Pupils are equal, round, and reactive to light.  Neck: Trachea normal and normal range of motion.  Cardiovascular: Normal rate and intact distal pulses.   Pulmonary/Chest: No respiratory distress.  Abdominal: Normal appearance. He exhibits no distension.  Musculoskeletal: Normal range of motion.  Lymphadenopathy:       Head (right side): No submandibular and no posterior auricular adenopathy present.       Head (left side): No submandibular and no posterior auricular adenopathy present.    He has no cervical adenopathy.    He has no axillary adenopathy.  Neurological: He is alert and oriented to person, place, and time. No cranial nerve deficit.  Skin: Skin is warm and dry. No rash noted.  Psychiatric: He has a normal mood and affect. His behavior is normal.  Nursing note and vitals reviewed.   ED Course  Procedures (including critical care time) Medications - No data to display  Labs Review Labs Reviewed  CBG MONITORING, ED - Abnormal; Notable  for the following:    Glucose-Capillary 246 (*)    All other components within normal limits      MDM   Final diagnoses:  Upper respiratory infection  Acute right otitis media, recurrence not specified, unspecified otitis media type        Dot Lanes, MD 07/23/14 1627

## 2014-09-11 DIAGNOSIS — H6983 Other specified disorders of Eustachian tube, bilateral: Secondary | ICD-10-CM | POA: Diagnosis not present

## 2014-09-11 DIAGNOSIS — J029 Acute pharyngitis, unspecified: Secondary | ICD-10-CM | POA: Diagnosis not present

## 2014-09-11 DIAGNOSIS — H109 Unspecified conjunctivitis: Secondary | ICD-10-CM | POA: Diagnosis not present

## 2014-09-11 DIAGNOSIS — J069 Acute upper respiratory infection, unspecified: Secondary | ICD-10-CM | POA: Diagnosis not present

## 2015-05-18 ENCOUNTER — Ambulatory Visit (INDEPENDENT_AMBULATORY_CARE_PROVIDER_SITE_OTHER): Payer: Medicare Other | Admitting: Emergency Medicine

## 2015-05-18 VITALS — BP 126/70 | HR 90 | Temp 98.5°F | Resp 18 | Ht 66.0 in | Wt 180.8 lb

## 2015-05-18 DIAGNOSIS — H9201 Otalgia, right ear: Secondary | ICD-10-CM | POA: Diagnosis not present

## 2015-05-18 DIAGNOSIS — H6121 Impacted cerumen, right ear: Secondary | ICD-10-CM

## 2015-05-18 DIAGNOSIS — J209 Acute bronchitis, unspecified: Secondary | ICD-10-CM

## 2015-05-18 MED ORDER — HYDROCOD POLST-CPM POLST ER 10-8 MG/5ML PO SUER: 5.0000 mL | Freq: Two times a day (BID) | ORAL | Status: DC

## 2015-05-18 MED ORDER — AZITHROMYCIN 250 MG PO TABS
ORAL_TABLET | ORAL | Status: DC
Start: 2015-05-18 — End: 2022-08-13

## 2015-05-18 MED ORDER — NEOMYCIN-POLYMYXIN-HC 3.5-10000-1 OT SUSP: 3.0000 [drp] | Freq: Four times a day (QID) | OTIC | Status: DC

## 2015-05-18 NOTE — Patient Instructions (Signed)

## 2015-05-18 NOTE — Progress Notes (Signed)
Subjective:  Patient ID: Logan Pitts, male    DOB: 1946/04/10  Age: 69 y.o. MRN: [id number]  CC: Sore Throat; chest congestion; and Otalgia   HPI Logan Pitts presents  patient has a cough and scant sputum production mucopurulent. Flow is noted to have pain in his right ear. He denies any drainage. Denies any fever chills. No wheezing or shortness of breath. Said that his ear is painful with movement of the external ear. He has some sore throat. No known nasal congestion postnasal drainage. No fever or chills. No nausea vomiting or stool change. No improvement with over-the-counter medication  History Logan Pitts has a past medical history of Allergy; Asthma; Diabetes mellitus without complication ([place]); Hypertension; DDD (degenerative disc disease), cervical; GERD (gastroesophageal reflux disease); Hypothyroidism; Radiation; Sleep apnea; Hernia, inguinal, bilateral; and Rash.   He has past surgical history that includes Nasal sinus surgery; shoulder joint surgery; Vasectomy; Inguinal hernia repair (Bilateral, 08/26/2013); and Insertion of mesh (Bilateral, 08/26/2013).   His  family history includes Cancer in his mother and sister; Heart disease in his father.  He   reports that he quit smoking about 36 years ago. He does not have any smokeless tobacco history on file. He reports that he drinks alcohol. He reports that he does not use illicit drugs.  Outpatient Prescriptions Prior to Visit  Medication Sig Dispense Refill  . albuterol (PROVENTIL HFA;VENTOLIN HFA) 108 (90 BASE) MCG/ACT inhaler Inhale 2 puffs into the lungs every 6 (six) hours as needed for wheezing.    Marland Kitchen amLODipine (NORVASC) 10 MG tablet Take 10 mg by mouth every morning.     Marland Kitchen aspirin 81 MG tablet Take 81 mg by mouth daily.    Marland Kitchen atorvastatin (LIPITOR) 80 MG tablet Take 80 mg by mouth every morning.     . budesonide-formoterol (SYMBICORT) 160-4.5 MCG/ACT inhaler Inhale 2 puffs into the lungs every evening.    .  Canagliflozin (INVOKANA) 100 MG TABS Take 1 tablet (100 mg total) by mouth every morning. 30 tablet 3  . ezetimibe (ZETIA) 10 MG tablet Take 10 mg by mouth every morning.     . flunisolide (NASALIDE) 25 MCG/ACT (0.025%) SOLN Inhale 2 sprays into the lungs every evening.     Marland Kitchen guaifenesin (HUMIBID E) 400 MG TABS tablet Take 400 mg by mouth every 4 (four) hours.    . insulin aspart protamine- aspart (NOVOLOG 70/30) (70-30) 100 UNIT/ML injection Inject 5-15 Units into the skin 3 (three) times daily with meals.     . insulin glargine (LANTUS) 100 UNIT/ML injection Inject 35 Units into the skin at bedtime.     Marland Kitchen levothyroxine (SYNTHROID, LEVOTHROID) 200 MCG tablet Take 200 mcg by mouth daily before breakfast.    . lisinopril (PRINIVIL,ZESTRIL) 40 MG tablet Take 40 mg by mouth every morning.     Marland Kitchen omeprazole (PRILOSEC) 20 MG capsule Take 20 mg by mouth daily.    Marland Kitchen PRECISION XTRA TEST STRIPS test strip     . pseudoephedrine (SUDAFED) 30 MG tablet Take 30 mg by mouth every 4 (four) hours as needed for congestion.    . TECHLITE LANCETS MISC     . vardenafil (LEVITRA) 20 MG tablet Take 20 mg by mouth daily as needed for erectile dysfunction.    Marland Kitchen amoxicillin-clavulanate (AUGMENTIN) 500-125 MG per tablet Take 1 tablet (500 mg total) by mouth every 8 (eight) hours. (Patient not taking: Reported on 05/18/2015) 21 tablet 0  . meloxicam (MOBIC) 15 MG tablet Take 15  mg by mouth daily.     Marland Kitchen oxyCODONE (OXY IR/ROXICODONE) 5 MG immediate release tablet Take 1-2 tablets (5-10 mg total) by mouth every 4 (four) hours as needed. (Patient not taking: Reported on 05/18/2015) 40 tablet 0   No facility-administered medications prior to visit.    Social History   Social History  . Marital Status: Married    Spouse Name: N/A  . Number of Children: N/A  . Years of Education: N/A   Social History Main Topics  . Smoking status: Former Smoker    Quit date: 08/21/1978  . Smokeless tobacco: None  . Alcohol Use: Yes      Comment: 4-5 DRINKS PER DAY  . Drug Use: No  . Sexual Activity: Not Asked   Other Topics Concern  . None   Social History Narrative     Review of Systems  Constitutional: Negative for fever, chills and appetite change.  HENT: Positive for congestion and ear pain. Negative for postnasal drip, sinus pressure and sore throat.   Eyes: Negative for pain and redness.  Respiratory: Positive for cough. Negative for shortness of breath and wheezing.   Cardiovascular: Negative for leg swelling.  Gastrointestinal: Negative for nausea, vomiting, abdominal pain, diarrhea, constipation and blood in stool.  Endocrine: Negative for polyuria.  Genitourinary: Negative for dysuria, urgency, frequency and flank pain.  Musculoskeletal: Negative for gait problem.  Skin: Negative for rash.  Neurological: Negative for weakness and headaches.  Psychiatric/Behavioral: Negative for confusion and decreased concentration. The patient is not nervous/anxious.     Objective:  BP 126/70 mmHg  Pulse 90  Temp(Src) 98.5 F (36.9 C) (Oral)  Resp 18  Ht 5\' 6"  (1.676 m)  Wt 180 lb 12.8 oz (82.01 kg)  BMI 29.20 kg/m2  SpO2 96%  Physical Exam  Constitutional: He is oriented to person, place, and time. He appears well-developed and well-nourished. No distress.  HENT:  Head: Normocephalic and atraumatic.  Right Ear: External ear normal.  Left Ear: External ear normal.  Nose: Nose normal.  Right cerumen impaction  Eyes: Conjunctivae and EOM are normal. Pupils are equal, round, and reactive to light. No scleral icterus.  Neck: Normal range of motion. Neck supple. No tracheal deviation present.  Cardiovascular: Normal rate, regular rhythm and normal heart sounds.   Pulmonary/Chest: Effort normal. No respiratory distress. He has no wheezes. He has no rales.  Abdominal: He exhibits no mass. There is no tenderness. There is no rebound and no guarding.  Musculoskeletal: He exhibits no edema.  Lymphadenopathy:     He has no cervical adenopathy.  Neurological: He is alert and oriented to person, place, and time. Coordination normal.  Skin: Skin is warm and dry. No rash noted.  Psychiatric: He has a normal mood and affect. His behavior is normal.      Assessment & Plan:   Logan Pitts was seen today for sore throat, chest congestion and otalgia.  Diagnoses and all orders for this visit:  Acute bronchitis, unspecified organism  Ear pain, right  Cerumen impaction, right  Other orders -     azithromycin (ZITHROMAX) 250 MG tablet; Take 2 tabs PO x 1 dose, then 1 tab PO QD x 4 days -     chlorpheniramine-HYDROcodone (TUSSIONEX PENNKINETIC ER) 10-8 MG/5ML SUER; Take 5 mLs by mouth 2 (two) times daily. -     neomycin-polymyxin-hydrocortisone (CORTISPORIN) 3.5-10000-1 otic suspension; Place 3 drops into the right ear 4 (four) times daily.   I am having Logan Pitts start on  azithromycin, chlorpheniramine-HYDROcodone, and neomycin-polymyxin-hydrocortisone. I am also having him maintain his levothyroxine, atorvastatin, ezetimibe, lisinopril, amLODipine, flunisolide, insulin glargine, insulin aspart protamine- aspart, omeprazole, albuterol, vardenafil, meloxicam, budesonide-formoterol, pseudoephedrine, guaifenesin, oxyCODONE, aspirin, PRECISION XTRA TEST STRIPS, TECHLITE LANCETS, canagliflozin, and amoxicillin-clavulanate.  Meds ordered this encounter  Medications  . azithromycin (ZITHROMAX) 250 MG tablet    Sig: Take 2 tabs PO x 1 dose, then 1 tab PO QD x 4 days    Dispense:  6 tablet    Refill:  0  . chlorpheniramine-HYDROcodone (TUSSIONEX PENNKINETIC ER) 10-8 MG/5ML SUER    Sig: Take 5 mLs by mouth 2 (two) times daily.    Dispense:  60 mL    Refill:  0  . neomycin-polymyxin-hydrocortisone (CORTISPORIN) 3.5-10000-1 otic suspension    Sig: Place 3 drops into the right ear 4 (four) times daily.    Dispense:  10 mL    Refill:  0   His right cerumen impaction was irrigated free  atraumatically  Appropriate red flag conditions were discussed with the patient as well as actions that should be taken.  Patient expressed his understanding.  Follow-up: Return if symptoms worsen or fail to improve.  Roselee Culver, MD

## 2015-05-20 DIAGNOSIS — L821 Other seborrheic keratosis: Secondary | ICD-10-CM | POA: Diagnosis not present

## 2015-05-20 DIAGNOSIS — D485 Neoplasm of uncertain behavior of skin: Secondary | ICD-10-CM | POA: Diagnosis not present

## 2015-05-20 DIAGNOSIS — Z872 Personal history of diseases of the skin and subcutaneous tissue: Secondary | ICD-10-CM | POA: Diagnosis not present

## 2015-05-20 DIAGNOSIS — D2339 Other benign neoplasm of skin of other parts of face: Secondary | ICD-10-CM | POA: Diagnosis not present

## 2015-05-20 DIAGNOSIS — Z86018 Personal history of other benign neoplasm: Secondary | ICD-10-CM | POA: Diagnosis not present

## 2015-05-20 DIAGNOSIS — L570 Actinic keratosis: Secondary | ICD-10-CM | POA: Diagnosis not present

## 2015-05-29 ENCOUNTER — Ambulatory Visit (INDEPENDENT_AMBULATORY_CARE_PROVIDER_SITE_OTHER): Payer: Medicare Other | Admitting: Emergency Medicine

## 2015-05-29 VITALS — BP 112/68 | HR 85 | Temp 98.8°F | Resp 16 | Ht 66.0 in | Wt 182.0 lb

## 2015-05-29 DIAGNOSIS — J209 Acute bronchitis, unspecified: Secondary | ICD-10-CM

## 2015-05-29 DIAGNOSIS — H109 Unspecified conjunctivitis: Secondary | ICD-10-CM

## 2015-05-29 DIAGNOSIS — E089 Diabetes mellitus due to underlying condition without complications: Secondary | ICD-10-CM

## 2015-05-29 DIAGNOSIS — H9201 Otalgia, right ear: Secondary | ICD-10-CM | POA: Diagnosis not present

## 2015-05-29 MED ORDER — OFLOXACIN 0.3 % OP SOLN: OPHTHALMIC | Status: DC

## 2015-05-29 NOTE — Progress Notes (Signed)
This chart was scribed for Logan Queen, MD by Thea Alken, ED Scribe. This patient was seen in room 10 and the patient's care was started at 10:15 AM.  Chief Complaint:  Chief Complaint  Patient presents with  . Eye Pain    Redness and pain in right eye  . Immunizations    Flu vaccine - Ask if we have a specific flu vaccine for the elderly?  . Ear Fullness    Right ear    HPI: GEORFFREY FOMBY is a 69 y.o. male who reports to [facility] today complaining of bilateral eye irritation. Pt was seen here 11 days ago for sinus infection, cough and chest tightness and was treated with a zpak. He reports improvement with symptoms but still has intermittent right otalgia. He reports a couple days ago he began to have irritation and redness in right eye with some drainage in the morning. Over the last day or so he has noticed irritation and redness has spread to left eye and an accumulation of pus to both lower lids, especially in the morning. He has been using nasal spray, prescribed by the [place].  Pt denies drug allergies.   Past Medical History  Diagnosis Date  . Allergy   . Asthma   . Diabetes mellitus without complication ([place])   . Hypertension   . DDD (degenerative disc disease), cervical   . GERD (gastroesophageal reflux disease)   . Hypothyroidism   . 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 Sleep apnea     USES C PAP   . Hernia, inguinal, bilateral   . Rash     ACROSS CHEST   Past Surgical History  Procedure Laterality Date  . Nasal sinus surgery    . Shoulder joint surgery    . Vasectomy    . Inguinal hernia repair Bilateral 08/26/2013    Procedure: LAPAROSCOPIC INGUINAL HERNIA BILATERAL WITH MESH;  Surgeon: Odis Hollingshead, MD;  Location: [facility];  Service: General;  Laterality: Bilateral;  . Insertion of mesh Bilateral 08/26/2013    Procedure: INSERTION OF MESH;  Surgeon: Odis Hollingshead, MD;  Location: [facility];  Service: General;  Laterality: Bilateral;   Social  History   Social History  . Marital Status: Married    Spouse Name: N/A  . Number of Children: N/A  . Years of Education: N/A   Social History Main Topics  . Smoking status: Former Smoker    Quit date: 08/21/1978  . Smokeless tobacco: None  . Alcohol Use: Yes     Comment: 4-5 DRINKS PER DAY  . Drug Use: No  . Sexual Activity: Not Asked   Other Topics Concern  . None   Social History Narrative   Family History  Problem Relation Age of Onset  . Cancer Mother   . Heart disease Father   . Cancer Sister    No Known Allergies Prior to Admission medications   Medication Sig Start Date End Date Taking? Authorizing Provider  albuterol (PROVENTIL HFA;VENTOLIN HFA) 108 (90 BASE) MCG/ACT inhaler Inhale 2 puffs into the lungs every 6 (six) hours as needed for wheezing.   Yes Historical Provider, MD  amLODipine (NORVASC) 10 MG tablet Take 10 mg by mouth every morning.    Yes Historical Provider, MD  aspirin 81 MG tablet Take 81 mg by mouth daily.   Yes Historical Provider, MD  atorvastatin (LIPITOR) 80 MG tablet Take 80 mg by mouth every morning.  Yes Historical Provider, MD  budesonide-formoterol (SYMBICORT) 160-4.5 MCG/ACT inhaler Inhale 2 puffs into the lungs every evening.   Yes Historical Provider, MD  Canagliflozin (INVOKANA) 100 MG TABS Take 1 tablet (100 mg total) by mouth every morning. 04/01/14  Yes Robyn Haber, MD  ezetimibe (ZETIA) 10 MG tablet Take 10 mg by mouth every morning.    Yes Historical Provider, MD  flunisolide (NASALIDE) 25 MCG/ACT (0.025%) SOLN Inhale 2 sprays into the lungs every evening.    Yes Historical Provider, MD  guaifenesin (HUMIBID E) 400 MG TABS tablet Take 400 mg by mouth every 4 (four) hours.   Yes Historical Provider, MD  insulin aspart protamine- aspart (NOVOLOG 70/30) (70-30) 100 UNIT/ML injection Inject 5-15 Units into the skin 3 (three) times daily with meals.    Yes Historical Provider, MD  insulin glargine (LANTUS) 100 UNIT/ML injection  Inject 35 Units into the skin at bedtime.    Yes Historical Provider, MD  levothyroxine (SYNTHROID, LEVOTHROID) 200 MCG tablet Take 200 mcg by mouth daily before breakfast.   Yes Historical Provider, MD  lisinopril (PRINIVIL,ZESTRIL) 40 MG tablet Take 40 mg by mouth every morning.    Yes Historical Provider, MD  omeprazole (PRILOSEC) 20 MG capsule Take 20 mg by mouth daily.   Yes Historical Provider, MD  PRECISION XTRA TEST STRIPS test strip  09/07/13  Yes Historical Provider, MD  pseudoephedrine (SUDAFED) 30 MG tablet Take 30 mg by mouth every 4 (four) hours as needed for congestion.   Yes Historical Provider, MD  TECHLITE LANCETS [place]  07/08/13  Yes Historical Provider, MD  vardenafil (LEVITRA) 20 MG tablet Take 20 mg by mouth daily as needed for erectile dysfunction.   Yes Historical Provider, MD  amoxicillin-clavulanate (AUGMENTIN) 500-125 MG per tablet Take 1 tablet (500 mg total) by mouth every 8 (eight) hours. Patient not taking: Reported on 05/18/2015 07/17/14   Leonard Schwartz, MD  azithromycin (ZITHROMAX) 250 MG tablet Take 2 tabs PO x 1 dose, then 1 tab PO QD x 4 days Patient not taking: Reported on 05/29/2015 05/18/15   Roselee Culver, MD  chlorpheniramine-HYDROcodone [facility] PENNKINETIC ER) 10-8 MG/5ML SUER Take 5 mLs by mouth 2 (two) times daily. Patient not taking: Reported on 05/29/2015 05/18/15   Roselee Culver, MD  meloxicam (MOBIC) 15 MG tablet Take 15 mg by mouth daily.  10/28/12   Historical Provider, MD  neomycin-polymyxin-hydrocortisone (CORTISPORIN) 3.5-10000-1 otic suspension Place 3 drops into the right ear 4 (four) times daily. Patient not taking: Reported on 05/29/2015 05/18/15   Roselee Culver, MD  oxyCODONE (OXY IR/ROXICODONE) 5 MG immediate release tablet Take 1-2 tablets (5-10 mg total) by mouth every 4 (four) hours as needed. Patient not taking: Reported on 05/18/2015 08/26/13   Jackolyn Confer, MD     ROS: The patient denies fevers, chills, night sweats,  unintentional weight loss, chest pain, palpitations, wheezing, dyspnea on exertion, nausea, vomiting, abdominal pain, dysuria, hematuria, melena, numbness, weakness, or tingling.   All other systems have been reviewed and were otherwise negative with the exception of those mentioned in the HPI and as above.    PHYSICAL EXAM: Filed Vitals:   05/29/15 0956  BP: 112/68  Pulse: 85  Temp: 98.8 F (37.1 C)  Resp: 16   Body mass index is 29.39 kg/(m^2).   General: Alert, no acute distress HEENT:  Normocephalic, atraumatic, oropharynx patent. Redness of conjunctiva of both eyes. Purulent present at both lower lids. Eye: Juliette Mangle [facility] Cardiovascular:  Regular rate and  rhythm, no rubs murmurs or gallops.  No Carotid bruits, radial pulse intact. No pedal edema.  Respiratory: Clear to auscultation bilaterally.  No wheezes, rales, or rhonchi.  No cyanosis, no use of accessory musculature Abdominal: No organomegaly, abdomen is soft and non-tender, positive bowel sounds.  No masses. Musculoskeletal: Gait intact. No edema, tenderness Skin: No rashes. Neurologic: Facial musculature symmetric. Psychiatric: Patient acts appropriately throughout our interaction. Lymphatic: No cervical or submandibular lymphadenopathy    LABS:    EKG/XRAY:   Primary read interpreted by Dr. Everlene Farrier at [facility].   ASSESSMENT/PLAN: Patient had a recent upper Restoril infection treated with Z-Pak. He presents today with bilateral conjunctiva otitis as well as eustachian tube dysfunction. He was given instructions to continue his steroid nasal spray as well as placed on antibiotics. Culture was done of a purulent drainage from his eyes.I personally performed the services described in this documentation, which was scribed in my presence. The recorded information has been reviewed and is accurate.   Gross sideeffects, risk and benefits, and alternatives of medications d/w patient. Patient is aware that all medications have  potential sideeffects and we are unable to predict every sideeffect or drug-drug interaction that may occur.  Logan Queen MD 05/29/2015 10:15 AM

## 2015-05-29 NOTE — Patient Instructions (Signed)
Barotitis Media Barotitis media is inflammation of your middle ear. This occurs when the auditory tube (eustachian tube) leading from the back of your nose (nasopharynx) to your eardrum is blocked. This blockage may result from a cold, environmental allergies, or an upper respiratory infection. Unresolved barotitis media may lead to damage or hearing loss (barotrauma), which may become permanent. HOME CARE INSTRUCTIONS   Use medicines as recommended by your health care provider. Over-the-counter medicines will help unblock the canal and can help during times of air travel.  Do not put anything into your ears to clean or unplug them. Eardrops will not be helpful.  Do not swim, dive, or fly until your health care provider says it is all right to do so. If these activities are necessary, chewing gum with frequent, forceful swallowing may help. It is also helpful to hold your nose and gently blow to pop your ears for equalizing pressure changes. This forces air into the eustachian tube.  Only take over-the-counter or prescription medicines for pain, discomfort, or fever as directed by your health care provider.  A decongestant may be helpful in decongesting the middle ear and make pressure equalization easier. SEEK MEDICAL CARE IF:  You experience a serious form of dizziness in which you feel as if the room is spinning and you feel nauseated (vertigo).  Your symptoms only involve one ear. SEEK IMMEDIATE MEDICAL CARE IF:   You develop a severe headache, dizziness, or severe ear pain.  You have bloody or pus-like drainage from your ears.  You develop a fever.  Your problems do not improve or become worse. MAKE SURE YOU:   Understand these instructions.  Will watch your condition.  Will get help right away if you are not doing well or get worse.   This information is not intended to replace advice given to you by your health care provider. Make sure you discuss any questions you have with  your health care provider.   Document Released: 06/30/2000 Document Revised: 04/23/2013 Document Reviewed: 01/28/2013 Elsevier Interactive Patient Education 2016 [employer] Bacterial Conjunctivitis Bacterial conjunctivitis, commonly called pink eye, is an inflammation of the clear membrane that covers the white part of the eye (conjunctiva). The inflammation can also happen on the underside of the eyelids. The blood vessels in the conjunctiva become inflamed, causing the eye to become red or pink. Bacterial conjunctivitis may spread easily from one eye to another and from person to person (contagious).  CAUSES  Bacterial conjunctivitis is caused by bacteria. The bacteria may come from your own skin, your upper respiratory tract, or from someone else with bacterial conjunctivitis. SYMPTOMS  The normally white color of the eye or the underside of the eyelid is usually pink or red. The pink eye is usually associated with irritation, tearing, and some sensitivity to light. Bacterial conjunctivitis is often associated with a thick, yellowish discharge from the eye. The discharge may turn into a crust on the eyelids overnight, which causes your eyelids to stick together. If a discharge is present, there may also be some blurred vision in the affected eye. DIAGNOSIS  Bacterial conjunctivitis is diagnosed by your caregiver through an eye exam and the symptoms that you report. Your caregiver looks for changes in the surface tissues of your eyes, which may point to the specific type of conjunctivitis. A sample of any discharge may be collected on a cotton-tip swab if you have a severe case of conjunctivitis, if your cornea is affected, or if you keep getting  repeat infections that do not respond to treatment. The sample will be sent to a lab to see if the inflammation is caused by a bacterial infection and to see if the infection will respond to antibiotic medicines. TREATMENT   Bacterial conjunctivitis is  treated with antibiotics. Antibiotic eyedrops are most often used. However, antibiotic ointments are also available. Antibiotics pills are sometimes used. Artificial tears or eye washes may ease discomfort. HOME CARE INSTRUCTIONS   To ease discomfort, apply a cool, clean washcloth to your eye for 10-20 minutes, 3-4 times a day.  Gently wipe away any drainage from your eye with a warm, wet washcloth or a cotton ball.  Wash your hands often with soap and water. Use paper towels to dry your hands.  Do not share towels or washcloths. This may spread the infection.  Change or wash your pillowcase every day.  You should not use eye makeup until the infection is gone.  Do not operate machinery or drive if your vision is blurred.  Stop using contact lenses. Ask your caregiver how to sterilize or replace your contacts before using them again. This depends on the type of contact lenses that you use.  When applying medicine to the infected eye, do not touch the edge of your eyelid with the eyedrop bottle or ointment tube. SEEK IMMEDIATE MEDICAL CARE IF:   Your infection has not improved within 3 days after beginning treatment.  You had yellow discharge from your eye and it returns.  You have increased eye pain.  Your eye redness is spreading.  Your vision becomes blurred.  You have a fever or persistent symptoms for more than 2-3 days.  You have a fever and your symptoms suddenly get worse.  You have facial pain, redness, or swelling. MAKE SURE YOU:   Understand these instructions.  Will watch your condition.  Will get help right away if you are not doing well or get worse.   This information is not intended to replace advice given to you by your health care provider. Make sure you discuss any questions you have with your health care provider.   Document Released: 07/03/2005 Document Revised: 07/24/2014 Document Reviewed: 12/04/2011 Elsevier Interactive Patient Education [employer]

## 2015-05-31 LAB — WOUND CULTURE
Gram Stain: NONE SEEN
Organism ID, Bacteria: NO GROWTH

## 2015-06-03 DIAGNOSIS — L570 Actinic keratosis: Secondary | ICD-10-CM | POA: Diagnosis not present

## 2015-08-16 DIAGNOSIS — Z23 Encounter for immunization: Secondary | ICD-10-CM | POA: Diagnosis not present

## 2015-08-16 DIAGNOSIS — L570 Actinic keratosis: Secondary | ICD-10-CM | POA: Diagnosis not present

## 2015-08-16 DIAGNOSIS — L821 Other seborrheic keratosis: Secondary | ICD-10-CM | POA: Diagnosis not present

## 2015-12-12 IMAGING — CR DG CHEST 2V
2 series · 2 of 2 positions shown · non-contrast
Comparison: None.

CLINICAL DATA: Preoperative evaluation for hernia surgery

EXAM:
CHEST  2 VIEW

[w chest pa]
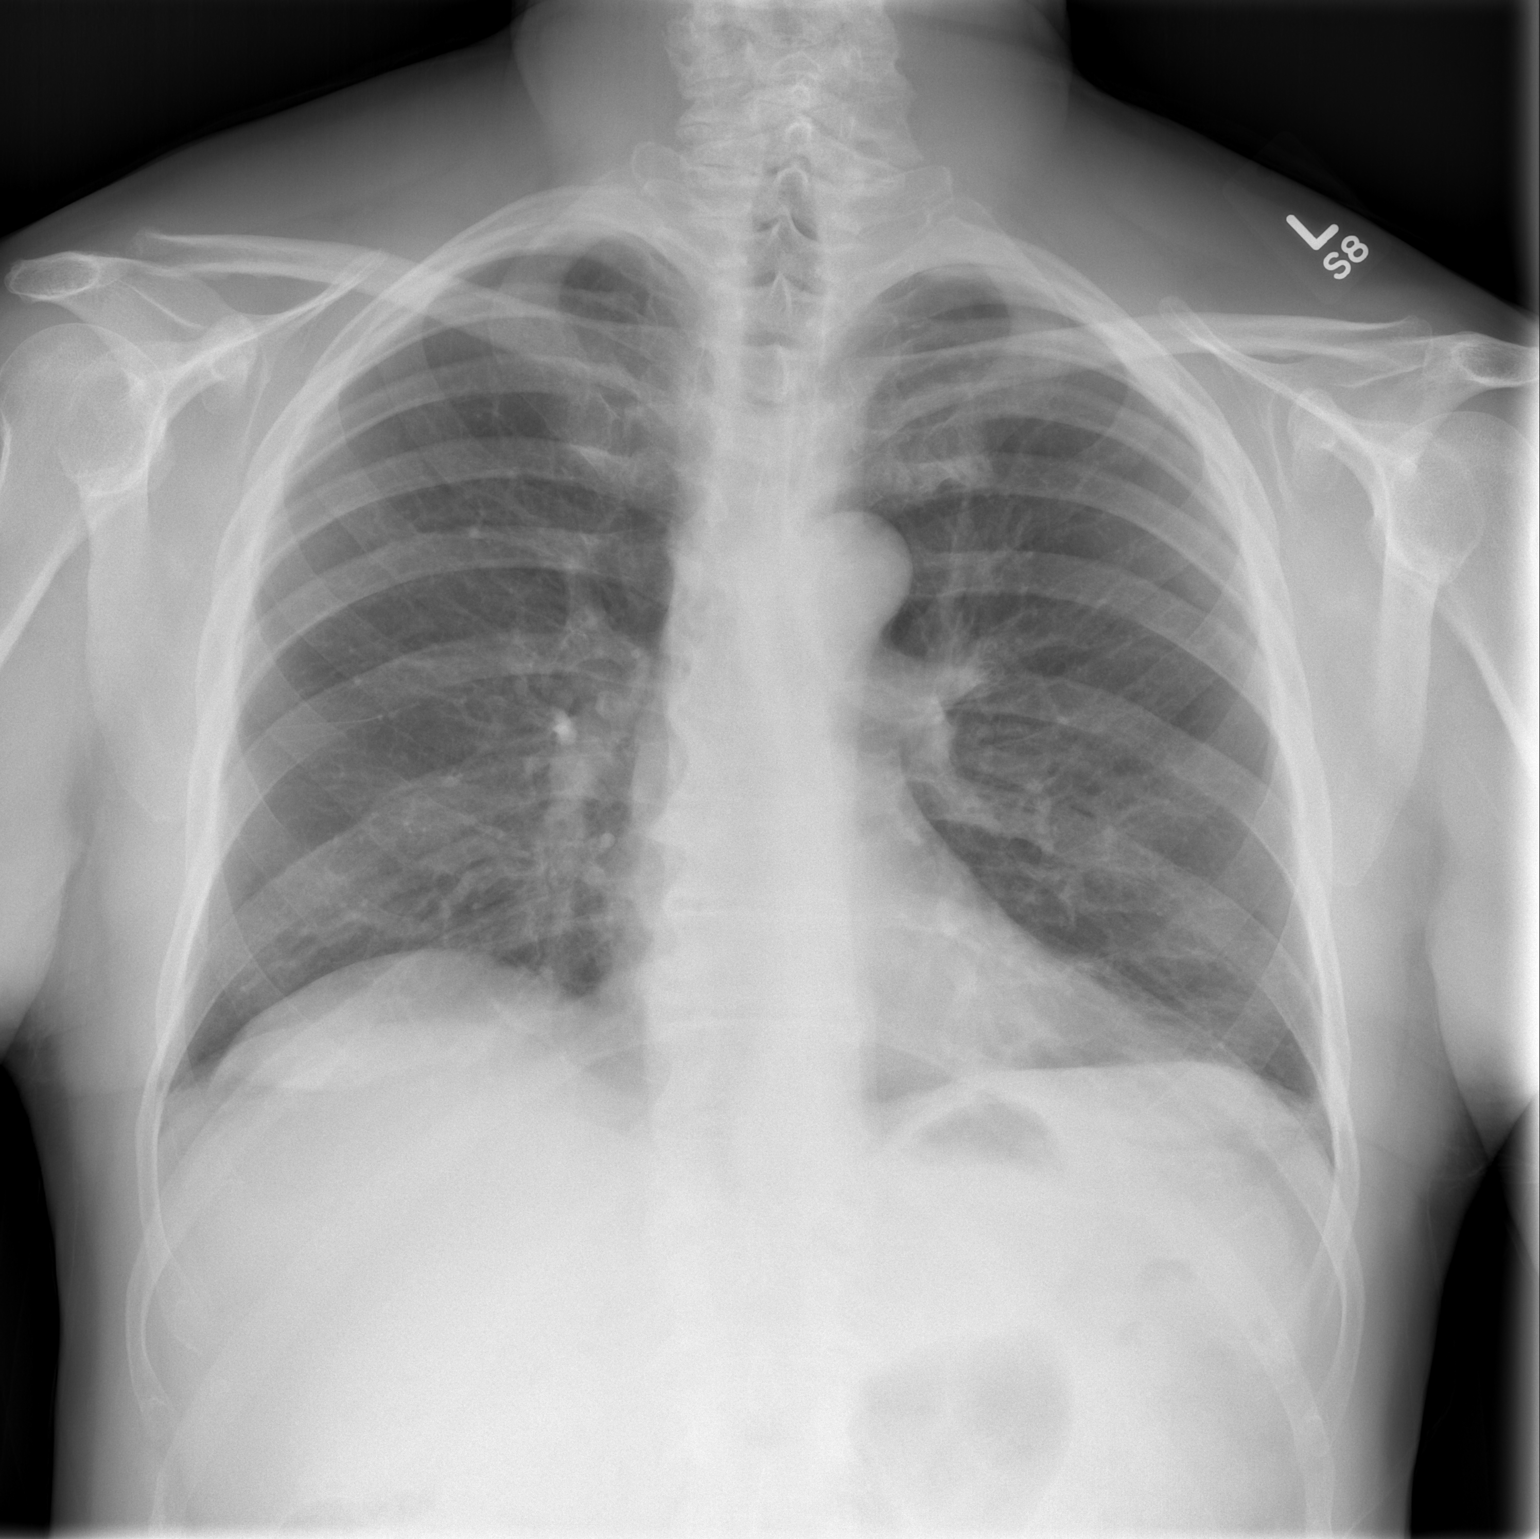

[w chest lat]
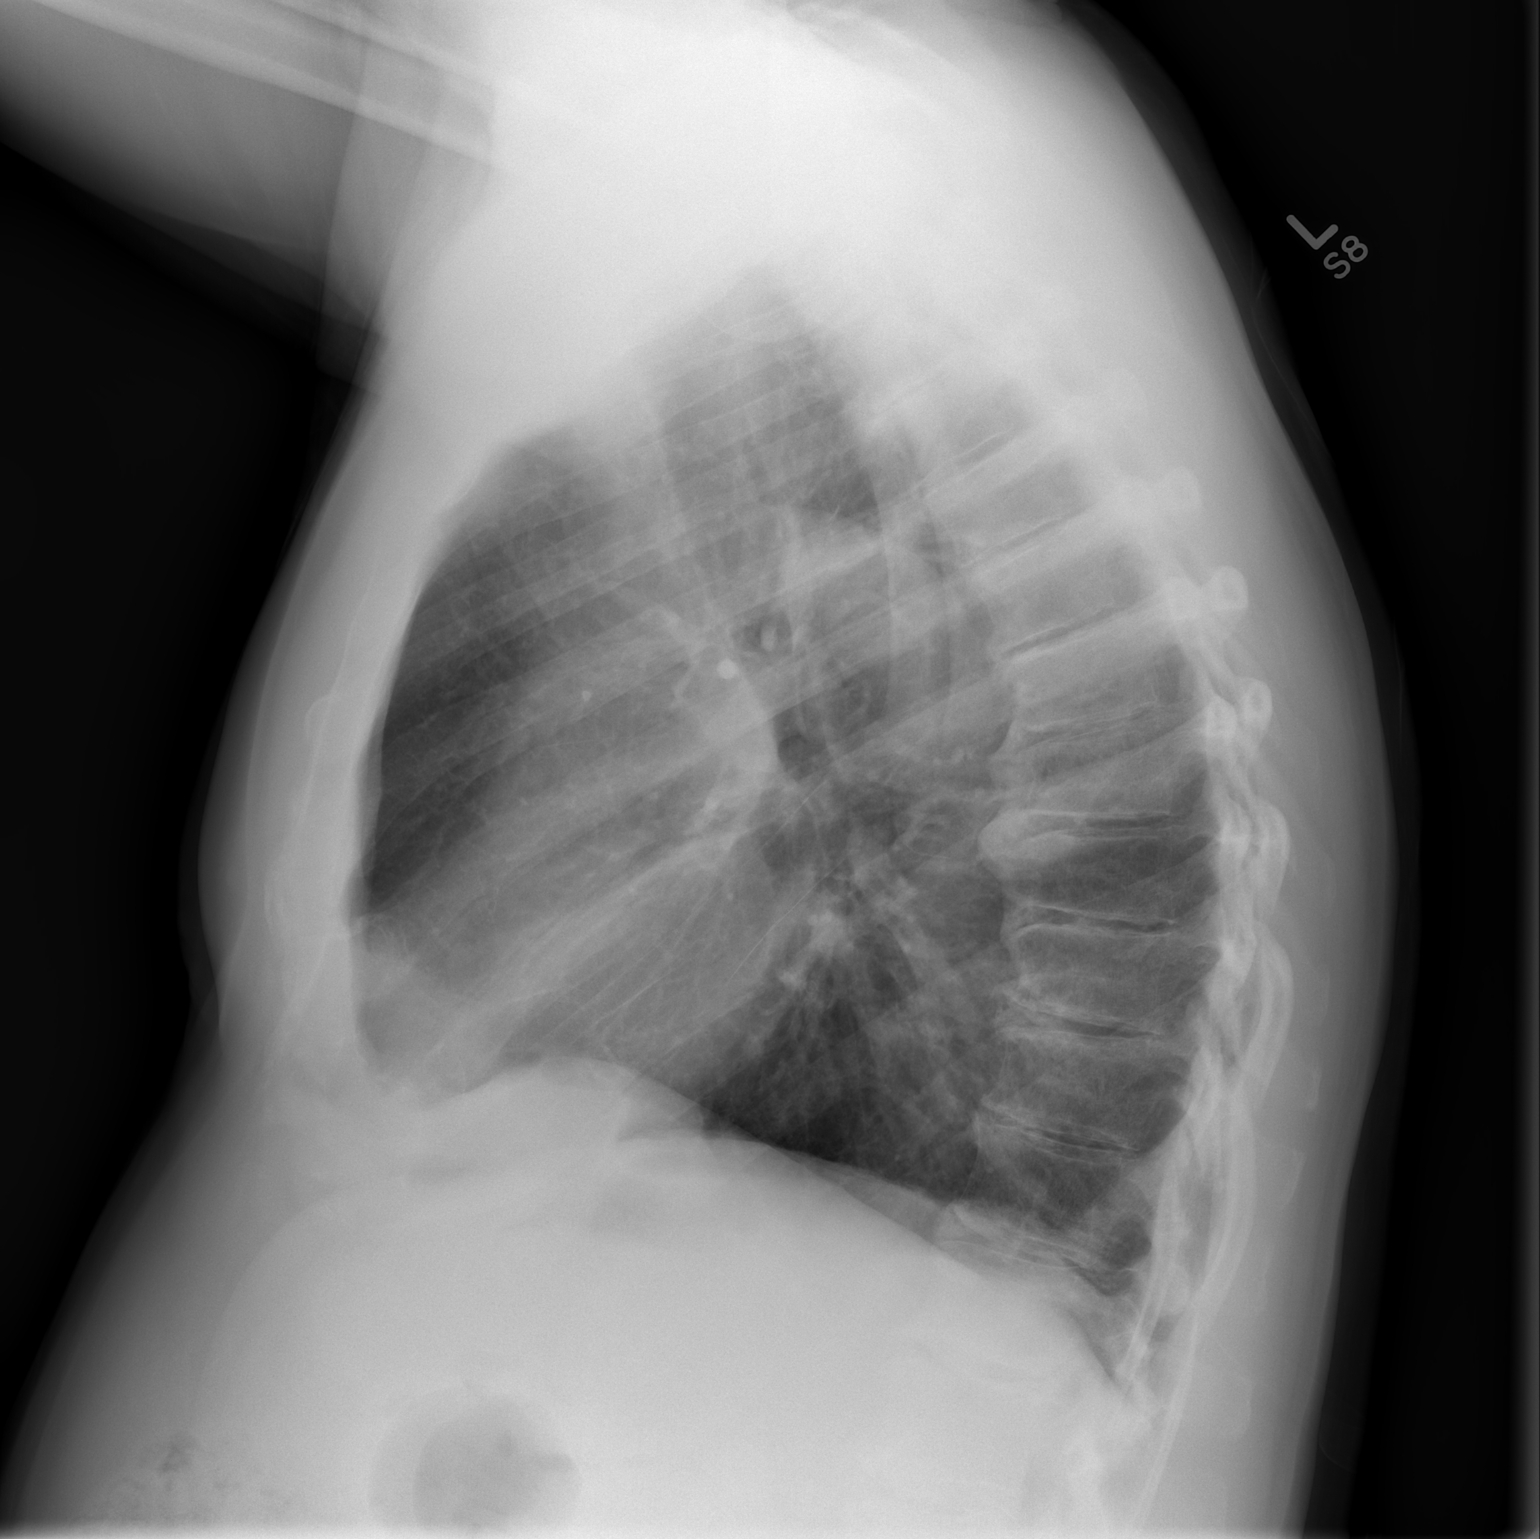

[2 of 2 positions shown; findings below may reference images not displayed]

FINDINGS: The heart size and mediastinal contours are within normal limits.
Both lungs are clear. The visualized skeletal structures are
unremarkable.
IMPRESSION: No active cardiopulmonary disease.

## 2016-05-26 DIAGNOSIS — Z23 Encounter for immunization: Secondary | ICD-10-CM | POA: Diagnosis not present

## 2016-06-01 DIAGNOSIS — L219 Seborrheic dermatitis, unspecified: Secondary | ICD-10-CM | POA: Diagnosis not present

## 2016-06-01 DIAGNOSIS — L821 Other seborrheic keratosis: Secondary | ICD-10-CM | POA: Diagnosis not present

## 2016-06-01 DIAGNOSIS — Z23 Encounter for immunization: Secondary | ICD-10-CM | POA: Diagnosis not present

## 2016-06-01 DIAGNOSIS — L570 Actinic keratosis: Secondary | ICD-10-CM | POA: Diagnosis not present

## 2016-06-01 DIAGNOSIS — Z872 Personal history of diseases of the skin and subcutaneous tissue: Secondary | ICD-10-CM | POA: Diagnosis not present

## 2016-06-01 DIAGNOSIS — Z86018 Personal history of other benign neoplasm: Secondary | ICD-10-CM | POA: Diagnosis not present

## 2016-07-25 DIAGNOSIS — L570 Actinic keratosis: Secondary | ICD-10-CM | POA: Diagnosis not present

## 2016-09-05 DIAGNOSIS — L570 Actinic keratosis: Secondary | ICD-10-CM | POA: Diagnosis not present

## 2016-09-11 DIAGNOSIS — J019 Acute sinusitis, unspecified: Secondary | ICD-10-CM | POA: Diagnosis not present

## 2017-01-25 DIAGNOSIS — H6501 Acute serous otitis media, right ear: Secondary | ICD-10-CM | POA: Diagnosis not present

## 2017-01-29 ENCOUNTER — Ambulatory Visit (INDEPENDENT_AMBULATORY_CARE_PROVIDER_SITE_OTHER): Payer: Medicare Other | Admitting: Podiatry

## 2017-01-29 ENCOUNTER — Encounter: Payer: Self-pay | Admitting: Podiatry

## 2017-01-29 VITALS — BP 139/73 | HR 87 | Ht 66.0 in | Wt 179.0 lb

## 2017-01-29 DIAGNOSIS — M216X1 Other acquired deformities of right foot: Secondary | ICD-10-CM | POA: Diagnosis not present

## 2017-01-29 DIAGNOSIS — M79671 Pain in right foot: Secondary | ICD-10-CM

## 2017-01-29 DIAGNOSIS — M79672 Pain in left foot: Secondary | ICD-10-CM

## 2017-01-29 DIAGNOSIS — M216X2 Other acquired deformities of left foot: Secondary | ICD-10-CM

## 2017-01-29 DIAGNOSIS — M722 Plantar fascial fibromatosis: Secondary | ICD-10-CM

## 2017-01-29 DIAGNOSIS — M21969 Unspecified acquired deformity of unspecified lower leg: Secondary | ICD-10-CM

## 2017-01-29 NOTE — Progress Notes (Signed)
SUBJECTIVE: 71 y.o. year old male presents complaining of left heel pain for 2 months. Been using OTC inserts and Crocks. Was busy moving relatives away from home. Now the heel is very painful. Been diabetic for over 20 years. Blood sugar this morning was at 140.  Thyroid, Asthma, HTN, Cholesterol,  REVIEW OF SYSTEMS: A comprehensive review of systems was negative except for: Thyroid problem, HTN, Asthma, hihg Cholesterol.  OBJECTIVE: DERMATOLOGIC EXAMINATION: No abnormal skin lesions.   VASCULAR EXAMINATION OF LOWER LIMBS: All pedal pulses are palpable with normal pulsation.  Capillary Filling times within 3 seconds in all digits.  No edema or erythema noted. Temperature gradient from tibial crest to dorsum of foot is within normal bilateral.  NEUROLOGIC EXAMINATION OF THE LOWER LIMBS: All epicritic and tactile sensations grossly intact.  MUSCULOSKELETAL EXAMINATION: Positive for tight Achilles tendon bilateral, excess sagittal plane motion of the first ray left, pain in left heel. Compensated rearfoot varus left.  RADIOGRAPHIC STUDIES:  AP View:  No gross deformities in AP view bilateral. Lateral view:  High arch foot with elevated first metatarsal left.  ASSESSMENT: Plantar fasciitis left. Compensated rearfoot varus left. Hypermobile first ray left.  PLAN: Reviewed clinical findings and available treatment options. Patient wants to wait on cortisone injection. Reviewed stretch exercise for tight Achilles tendon to practice daily. Night splint dispensed. Both feet casted for orthotics.

## 2017-01-29 NOTE — Patient Instructions (Signed)
Seen for pain in left heel. Noted of tightening achilles tendon, weakening first metatarsal bone. Reviewed available treatment options. Reviewed stretch exercise for tight Achilles tendon to follow daily. Both feet casted for orthotics. Dispensed Night Splint with instruction.

## 2017-03-26 ENCOUNTER — Ambulatory Visit (INDEPENDENT_AMBULATORY_CARE_PROVIDER_SITE_OTHER): Payer: Medicare Other | Admitting: Podiatry

## 2017-03-26 ENCOUNTER — Encounter: Payer: Self-pay | Admitting: Podiatry

## 2017-03-26 DIAGNOSIS — M21969 Unspecified acquired deformity of unspecified lower leg: Secondary | ICD-10-CM

## 2017-03-26 DIAGNOSIS — M722 Plantar fascial fibromatosis: Secondary | ICD-10-CM

## 2017-03-26 NOTE — Patient Instructions (Signed)
1 month orthotic check. Doing well with orthotics and splints. Start having knee pain and muscle aches. Need to stay off of orthotics till the knee pain subside. Then gradually get back on orthotics.

## 2017-03-26 NOTE — Progress Notes (Signed)
1 month orthotic check. Foot pain has subsided. Orthotics and Night Splint helped. Start having problem with knee and muscles on leg. Still getting adjusted.   Advised to stay off of orthotics till the knee pain subside.  Then gradually get back on orthotics.  Return as needed.

## 2017-04-18 DIAGNOSIS — M5441 Lumbago with sciatica, right side: Secondary | ICD-10-CM | POA: Diagnosis not present

## 2017-05-02 DIAGNOSIS — M545 Low back pain: Secondary | ICD-10-CM | POA: Diagnosis not present

## 2017-05-07 DIAGNOSIS — M5441 Lumbago with sciatica, right side: Secondary | ICD-10-CM | POA: Diagnosis not present

## 2017-05-11 DIAGNOSIS — M48061 Spinal stenosis, lumbar region without neurogenic claudication: Secondary | ICD-10-CM | POA: Diagnosis not present

## 2017-05-24 DIAGNOSIS — E119 Type 2 diabetes mellitus without complications: Secondary | ICD-10-CM | POA: Diagnosis not present

## 2017-05-29 DIAGNOSIS — M48061 Spinal stenosis, lumbar region without neurogenic claudication: Secondary | ICD-10-CM | POA: Diagnosis not present

## 2017-05-30 DIAGNOSIS — J180 Bronchopneumonia, unspecified organism: Secondary | ICD-10-CM | POA: Diagnosis not present

## 2017-06-18 DIAGNOSIS — J45909 Unspecified asthma, uncomplicated: Secondary | ICD-10-CM | POA: Diagnosis not present

## 2018-08-15 ENCOUNTER — Other Ambulatory Visit: Payer: Self-pay

## 2018-08-15 ENCOUNTER — Emergency Department (HOSPITAL_BASED_OUTPATIENT_CLINIC_OR_DEPARTMENT_OTHER): Payer: Medicare Other

## 2018-08-15 ENCOUNTER — Encounter (HOSPITAL_BASED_OUTPATIENT_CLINIC_OR_DEPARTMENT_OTHER): Payer: Self-pay

## 2018-08-15 ENCOUNTER — Emergency Department (HOSPITAL_BASED_OUTPATIENT_CLINIC_OR_DEPARTMENT_OTHER)
Admission: EM | Admit: 2018-08-15 | Discharge: 2018-08-15 | Disposition: A | Discharge status: Home / Self Care | Payer: Medicare Other | Attending: Emergency Medicine | Admitting: Emergency Medicine

## 2018-08-15 DIAGNOSIS — Y9289 Other specified places as the place of occurrence of the external cause: Secondary | ICD-10-CM | POA: Diagnosis not present

## 2018-08-15 DIAGNOSIS — W298XXA Contact with other powered powered hand tools and household machinery, initial encounter: Secondary | ICD-10-CM | POA: Insufficient documentation

## 2018-08-15 DIAGNOSIS — S61235A Puncture wound without foreign body of left ring finger without damage to nail, initial encounter: Secondary | ICD-10-CM | POA: Insufficient documentation

## 2018-08-15 DIAGNOSIS — Y998 Other external cause status: Secondary | ICD-10-CM | POA: Diagnosis not present

## 2018-08-15 DIAGNOSIS — T148XXA Other injury of unspecified body region, initial encounter: Secondary | ICD-10-CM

## 2018-08-15 DIAGNOSIS — Y9389 Activity, other specified: Secondary | ICD-10-CM | POA: Insufficient documentation

## 2018-08-15 MED ORDER — CEPHALEXIN 500 MG PO CAPS: ORAL_CAPSULE | ORAL | 0 refills | Status: DC

## 2018-08-15 NOTE — ED Provider Notes (Signed)
[facility] EMERGENCY DEPARTMENT Provider Note   CSN: [id number] Arrival date & time: 08/15/18  1540     History   Chief Complaint Chief Complaint  Patient presents with  . Finger Injury    HPI ISHMAIL MCMANAMON is a 73 y.o. male who injuresd his left ring finger just PTA. The patient was using a small star shaped drill bit when it slipped and punctured his finger. He denies numbness or tingling. Bleeding is controlled. UTD on his Tdap.   HPI  Past Medical History:  Diagnosis Date  . Allergy   . Asthma   . DDD (degenerative disc disease), cervical   . Diabetes mellitus without complication ([place])   . GERD (gastroesophageal reflux disease)   . Hernia, inguinal, bilateral   . Hypertension   . Hypothyroidism   . 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 Rash    ACROSS CHEST  . Sleep apnea    USES C PAP     Patient Active Problem List   Diagnosis Date Noted  . Diabetes mellitus due to underlying condition without complication ([place]) 25/63/8937  . Groin strain-left 11/20/2013    Past Surgical History:  Procedure Laterality Date  . INGUINAL HERNIA REPAIR Bilateral 08/26/2013   Procedure: LAPAROSCOPIC INGUINAL HERNIA BILATERAL WITH MESH;  Surgeon: Odis Hollingshead, MD;  Location: [facility];  Service: General;  Laterality: Bilateral;  . INSERTION OF MESH Bilateral 08/26/2013   Procedure: INSERTION OF MESH;  Surgeon: Odis Hollingshead, MD;  Location: [facility];  Service: General;  Laterality: Bilateral;  . NASAL SINUS SURGERY    . shoulder joint surgery    . VASECTOMY          Home Medications    Prior to Admission medications   Medication Sig Start Date End Date Taking? Authorizing Provider  albuterol (PROVENTIL HFA;VENTOLIN HFA) 108 (90 BASE) MCG/ACT inhaler Inhale 2 puffs into the lungs every 6 (six) hours as needed for wheezing.    [provider]  amLODipine (NORVASC) 10 MG tablet Take 10 mg by mouth every morning.     [provider]    amoxicillin-clavulanate (AUGMENTIN) 500-125 MG per tablet Take 1 tablet (500 mg total) by mouth every 8 (eight) hours. 07/17/14   Leonard Schwartz, MD  aspirin 81 MG tablet Take 81 mg by mouth daily.    [provider]  atorvastatin (LIPITOR) 80 MG tablet Take 80 mg by mouth every morning.     [provider]  azithromycin (ZITHROMAX) 250 MG tablet Take 2 tabs PO x 1 dose, then 1 tab PO QD x 4 days 05/18/15   Roselee Culver, MD  budesonide-formoterol [facility]) 160-4.5 MCG/ACT inhaler Inhale 2 puffs into the lungs every evening.    [provider]  Canagliflozin (INVOKANA) 100 MG TABS Take 1 tablet (100 mg total) by mouth every morning. 04/01/14   Robyn Haber, MD  cephALEXin (KEFLEX) 500 MG capsule 2 caps po bid x 7 days 08/15/18   Margarita Mail, PA-C  chlorpheniramine-HYDROcodone [facility]) 10-8 MG/5ML SUER Take 5 mLs by mouth 2 (two) times daily. 05/18/15   Roselee Culver, MD  ezetimibe (ZETIA) 10 MG tablet Take 10 mg by mouth every morning.     [provider]  flunisolide (NASALIDE) 25 MCG/ACT (0.025%) SOLN Inhale 2 sprays into the lungs every evening.     [provider]  guaifenesin (HUMIBID E) 400 MG TABS tablet Take 400 mg by mouth every 4 (  four) hours.    [provider]  insulin aspart protamine- aspart (NOVOLOG 70/30) (70-30) 100 UNIT/ML injection Inject 5-15 Units into the skin 3 (three) times daily with meals.     [provider]  insulin glargine (LANTUS) 100 UNIT/ML injection Inject 35 Units into the skin at bedtime.     [provider]  levothyroxine (SYNTHROID, LEVOTHROID) 200 MCG tablet Take 200 mcg by mouth daily before breakfast.    [provider]  lisinopril (PRINIVIL,ZESTRIL) 40 MG tablet Take 40 mg by mouth every morning.     [provider]  meloxicam (MOBIC) 15 MG tablet Take 15 mg by mouth daily.  10/28/12   [provider]   neomycin-polymyxin-hydrocortisone (CORTISPORIN) 3.5-10000-1 otic suspension Place 3 drops into the right ear 4 (four) times daily. 05/18/15   Roselee Culver, MD  ofloxacin (OCUFLOX) 0.3 % ophthalmic solution One drop each eye 4 times a day 05/29/15   Darlyne Russian, MD  omeprazole (PRILOSEC) 20 MG capsule Take 20 mg by mouth daily.    [provider]  oxyCODONE (OXY IR/ROXICODONE) 5 MG immediate release tablet Take 1-2 tablets (5-10 mg total) by mouth every 4 (four) hours as needed. 08/26/13   Jackolyn Confer, MD  PRECISION XTRA TEST STRIPS test strip  09/07/13   [provider]  pseudoephedrine (SUDAFED) 30 MG tablet Take 30 mg by mouth every 4 (four) hours as needed for congestion.    [provider]  TECHLITE LANCETS [place]  07/08/13   [provider]  vardenafil (LEVITRA) 20 MG tablet Take 20 mg by mouth daily as needed for erectile dysfunction.    [provider]    Family History Family History  Problem Relation Age of Onset  . Cancer Mother   . Heart disease Father   . Cancer Sister     Social History Social History   Tobacco Use  . Smoking status: Former Smoker    Last attempt to quit: 08/21/1978    Years since quitting: 40.0  . Smokeless tobacco: Never Used  Substance Use Topics  . Alcohol use: Yes    Comment: daily  . Drug use: No     Allergies   Patient has no known allergies.   Review of Systems Review of Systems Ten systems reviewed and are negative for acute change, except as noted in the HPI.    Physical Exam Updated Vital Signs BP (!) 147/80 (BP Location: Left Arm)   Pulse 80   Temp 97.8 F (36.6 C) (Oral)   Resp 18   Ht 5\' 7"  (1.702 m)   Wt 88 kg   SpO2 98%   BMI 30.38 kg/m   Physical Exam Vitals signs and nursing note reviewed.  Constitutional:      General: He is not in acute distress.    Appearance: He is well-developed. He is not diaphoretic.  HENT:     Head: Normocephalic and atraumatic.   Eyes:     General: No scleral icterus.    Conjunctiva/sclera: Conjunctivae normal.  Neck:     Musculoskeletal: Normal range of motion and neck supple.  Cardiovascular:     Rate and Rhythm: Normal rate and regular rhythm.     Heart sounds: Normal heart sounds.  Pulmonary:     Effort: Pulmonary effort is normal. No respiratory distress.     Breath sounds: Normal breath sounds.  Abdominal:     Palpations: Abdomen is soft.     Tenderness: There is no  abdominal tenderness.  Musculoskeletal:     Left hand: He exhibits laceration. He exhibits normal capillary refill. Normal sensation noted. Normal strength noted.       Hands:  Skin:    General: Skin is warm and dry.  Neurological:     Mental Status: He is alert.  Psychiatric:        Behavior: Behavior normal.      ED Treatments / Results  Labs (all labs ordered are listed, but only abnormal results are displayed) Labs Reviewed - No data to display  EKG None  Radiology Dg Finger Ring Left  Result Date: 08/15/2018 CLINICAL DATA:  Trauma. Drill bit injury near distal interphalangeal joint. EXAM: LEFT RING FINGER 2+V COMPARISON:  None. FINDINGS: There is no evidence of fracture or dislocation. There is no evidence of arthropathy or other focal bone abnormality. Soft tissues are unremarkable. IMPRESSION: Negative. Electronically Signed   By: Dorise Bullion III M.D   On: 08/15/2018 16:31    Procedures Procedures (including critical care time)  Medications Ordered in ED Medications - No data to display   Initial Impression / Assessment and Plan / ED Course  I have reviewed the triage vital signs and the nursing notes.  Pertinent labs & imaging results that were available during my care of the patient were reviewed by me and considered in my medical decision making (see chart for details).     Patient with stellate puncture wound to the left ring finger. No need for repair. Wound cleanse and bandaged. Finger splint applied  for protection.  F/u with hand specialist. Discussed return precautions.  Final Clinical Impressions(s) / ED Diagnoses   Final diagnoses:  Puncture wound    ED Discharge Orders         Ordered    cephALEXin (KEFLEX) 500 MG capsule     08/15/18 1723           Margarita Mail, PA-C 08/16/18 1521    Drenda Freeze, MD 08/17/18 [phone]

## 2018-08-15 NOTE — ED Triage Notes (Addendum)
Pt reports left ring finger vs "star drill" ~3pm-small puncture wound noted to tip-NAD-steady gait

## 2018-08-15 NOTE — Discharge Instructions (Addendum)
Contact a health care provider if: You received a tetanus shot and you have swelling, severe pain, redness, or bleeding at the injection site. You have a fever. Your sutures come out. You notice a bad smell coming from your wound or your dressing. You notice something coming out of your wound, such as wood or glass. Your pain is not controlled with medicine. You have increased redness, swelling, or pain at the site of your wound. You have fluid, blood, or pus coming from your wound. You notice a change in the color of your skin near your wound. You need to change the dressing frequently due to fluid, blood, or pus draining from your wound. You develop a new rash. You develop numbness around your wound. You have warmth around your wound. Get help right away if: You develop severe swelling around your wound. Your pain suddenly increases and is severe. You develop painful skin lumps. You have a red streak going away from your wound. The wound is on your hand or foot and you: Cannot properly move a finger or toe. Notice that your fingers or toes look pale or bluish.

## 2019-08-18 ENCOUNTER — Ambulatory Visit: Payer: Medicare Other

## 2019-08-22 ENCOUNTER — Ambulatory Visit: Payer: Medicare Other | Attending: Internal Medicine

## 2019-08-22 DIAGNOSIS — Z23 Encounter for immunization: Secondary | ICD-10-CM | POA: Insufficient documentation

## 2019-08-22 NOTE — Progress Notes (Signed)
   Covid-19 Vaccination Clinic  Name:  Logan Pitts    MRN: [id number] DOB: September 26, 1945  08/22/2019  Mr. Peri was observed post Covid-19 immunization for 15 minutes without incidence. He was provided with Vaccine Information Sheet and instruction to access the V-Safe system.   [name] was instructed to call 911 with any severe reactions post vaccine: Marland Kitchen Difficulty breathing  . Swelling of your face and throat  . A fast heartbeat  . A bad rash all over your body  . Dizziness and weakness    Immunizations Administered    Name Date Dose VIS Date Route   Pfizer COVID-19 Vaccine 08/22/2019  8:33 AM 0.3 mL 06/27/2019 Intramuscular   Manufacturer: [place]   Lot: [id number]   [place]: [id number]

## 2019-09-16 ENCOUNTER — Ambulatory Visit: Payer: Medicare Other | Attending: Internal Medicine

## 2019-09-16 DIAGNOSIS — Z23 Encounter for immunization: Secondary | ICD-10-CM | POA: Insufficient documentation

## 2019-09-16 NOTE — Progress Notes (Signed)
   Covid-19 Vaccination Clinic  Name:  Logan Pitts    MRN: [id number] DOB: November 12, 1945  09/16/2019  [name] was observed post Covid-19 immunization for 15 minutes without incident. He was provided with Vaccine Information Sheet and instruction to access the V-Safe system.   Mr. Jeffres was instructed to call 911 with any severe reactions post vaccine: Marland Kitchen Difficulty breathing  . Swelling of face and throat  . A fast heartbeat  . A bad rash all over body  . Dizziness and weakness   Immunizations Administered    Name Date Dose VIS Date Route   Pfizer COVID-19 Vaccine 09/16/2019  9:08 AM 0.3 mL 06/27/2019 Intramuscular   Manufacturer: [place]   Lot: [id number]   [place]: [id number]

## 2020-04-23 DIAGNOSIS — Z23 Encounter for immunization: Secondary | ICD-10-CM | POA: Diagnosis not present

## 2020-05-24 ENCOUNTER — Ambulatory Visit: Payer: Medicare Other

## 2020-11-01 DIAGNOSIS — M1712 Unilateral primary osteoarthritis, left knee: Secondary | ICD-10-CM | POA: Diagnosis not present

## 2020-11-12 DIAGNOSIS — U071 COVID-19: Secondary | ICD-10-CM | POA: Diagnosis not present

## 2020-11-30 DIAGNOSIS — M47816 Spondylosis without myelopathy or radiculopathy, lumbar region: Secondary | ICD-10-CM | POA: Diagnosis not present

## 2020-12-05 IMAGING — CR DG FINGER RING 2+V*L*
3 series · 3 of 3 positions shown · non-contrast
Comparison: None.

CLINICAL DATA: Trauma. Drill bit injury near distal interphalangeal
joint.

EXAM:
LEFT RING FINGER 2+V

[x finger pa left]
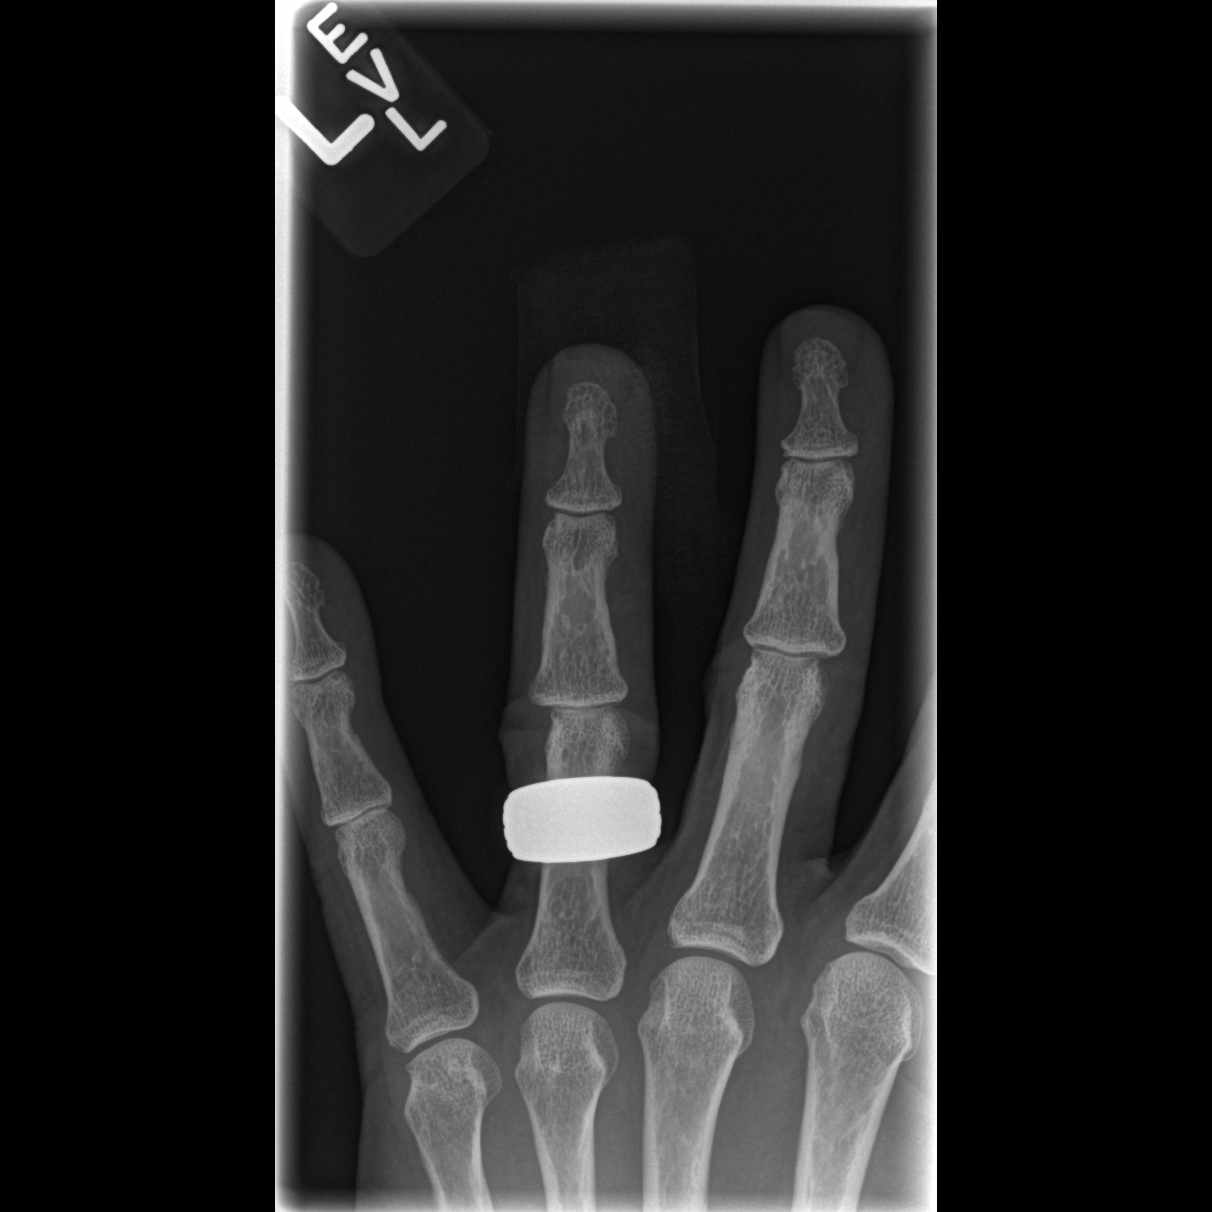

[x finger obl. left]
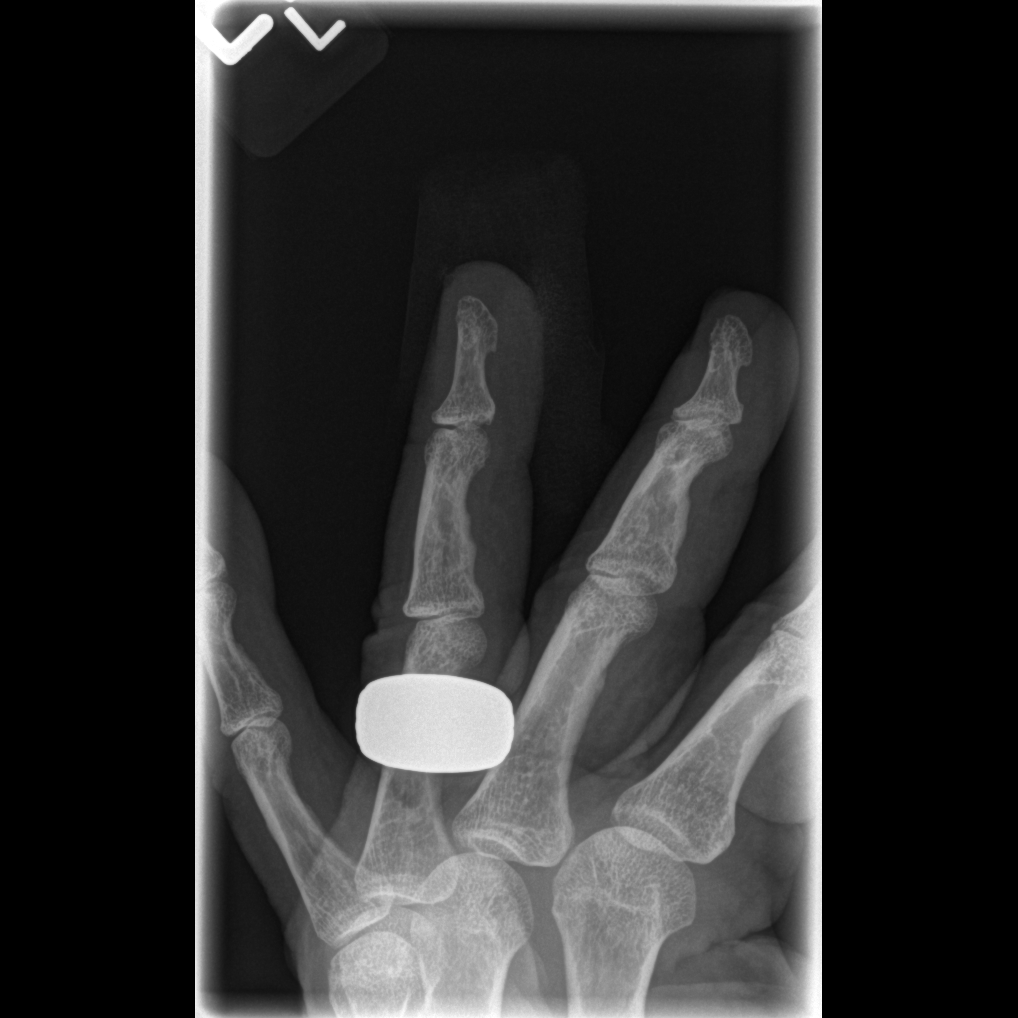

[x finger lateral left]
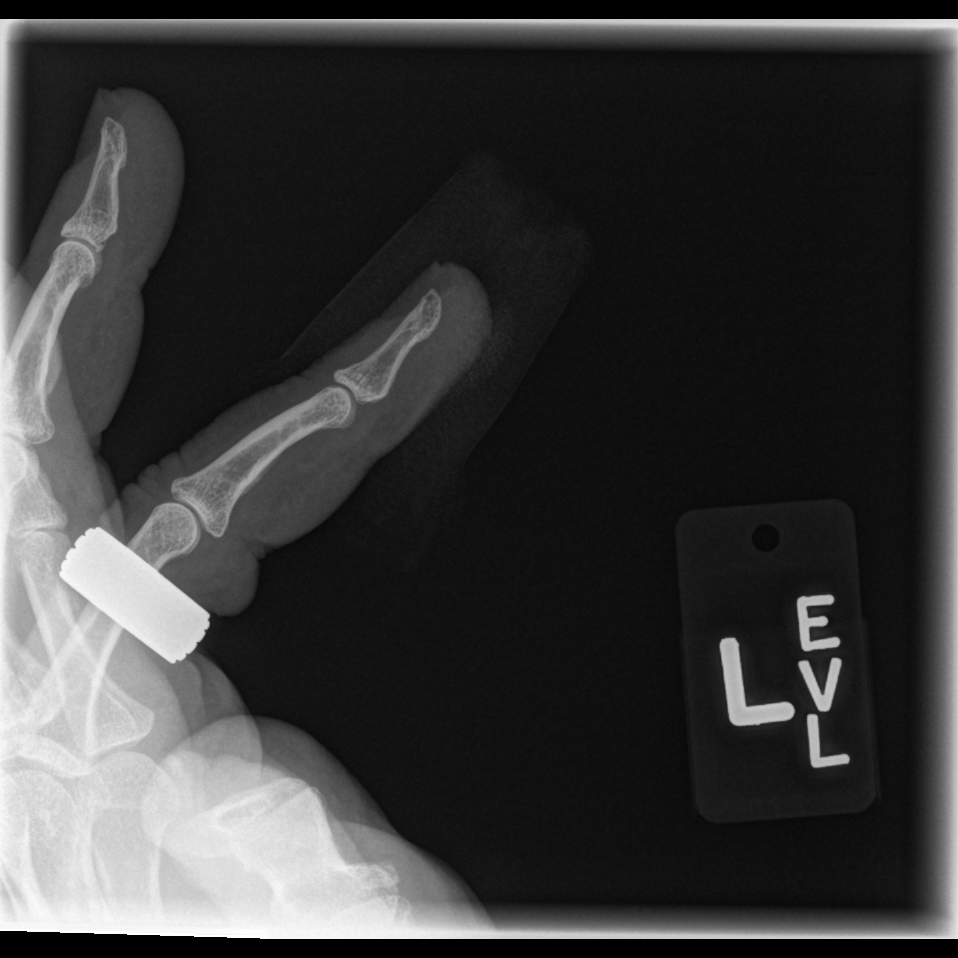

[3 of 3 positions shown; findings below may reference images not displayed]

FINDINGS: There is no evidence of fracture or dislocation. There is no
evidence of arthropathy or other focal bone abnormality. Soft
tissues are unremarkable.
IMPRESSION: Negative.

## 2020-12-16 DIAGNOSIS — M47816 Spondylosis without myelopathy or radiculopathy, lumbar region: Secondary | ICD-10-CM | POA: Diagnosis not present

## 2020-12-16 DIAGNOSIS — U071 COVID-19: Secondary | ICD-10-CM | POA: Diagnosis not present

## 2020-12-18 DIAGNOSIS — Z20822 Contact with and (suspected) exposure to covid-19: Secondary | ICD-10-CM | POA: Diagnosis not present

## 2020-12-29 DIAGNOSIS — M1712 Unilateral primary osteoarthritis, left knee: Secondary | ICD-10-CM | POA: Diagnosis not present

## 2021-01-07 DIAGNOSIS — M47816 Spondylosis without myelopathy or radiculopathy, lumbar region: Secondary | ICD-10-CM | POA: Diagnosis not present

## 2021-01-14 DIAGNOSIS — M1712 Unilateral primary osteoarthritis, left knee: Secondary | ICD-10-CM | POA: Diagnosis not present

## 2021-01-21 DIAGNOSIS — M1712 Unilateral primary osteoarthritis, left knee: Secondary | ICD-10-CM | POA: Diagnosis not present

## 2021-01-28 DIAGNOSIS — M1712 Unilateral primary osteoarthritis, left knee: Secondary | ICD-10-CM | POA: Diagnosis not present

## 2021-12-06 DIAGNOSIS — M47816 Spondylosis without myelopathy or radiculopathy, lumbar region: Secondary | ICD-10-CM | POA: Diagnosis not present

## 2021-12-27 DIAGNOSIS — M47816 Spondylosis without myelopathy or radiculopathy, lumbar region: Secondary | ICD-10-CM | POA: Diagnosis not present

## 2022-01-26 DIAGNOSIS — M47816 Spondylosis without myelopathy or radiculopathy, lumbar region: Secondary | ICD-10-CM | POA: Diagnosis not present

## 2022-02-10 DIAGNOSIS — M47816 Spondylosis without myelopathy or radiculopathy, lumbar region: Secondary | ICD-10-CM | POA: Diagnosis not present

## 2022-02-17 DIAGNOSIS — M19042 Primary osteoarthritis, left hand: Secondary | ICD-10-CM | POA: Diagnosis not present

## 2022-02-17 DIAGNOSIS — Z6827 Body mass index (BMI) 27.0-27.9, adult: Secondary | ICD-10-CM | POA: Diagnosis not present

## 2022-02-17 DIAGNOSIS — M79645 Pain in left finger(s): Secondary | ICD-10-CM | POA: Diagnosis not present

## 2022-02-17 DIAGNOSIS — S61213A Laceration without foreign body of left middle finger without damage to nail, initial encounter: Secondary | ICD-10-CM | POA: Diagnosis not present

## 2022-03-16 DIAGNOSIS — M47816 Spondylosis without myelopathy or radiculopathy, lumbar region: Secondary | ICD-10-CM | POA: Diagnosis not present

## 2022-04-07 DIAGNOSIS — K219 Gastro-esophageal reflux disease without esophagitis: Secondary | ICD-10-CM | POA: Diagnosis not present

## 2022-04-21 DIAGNOSIS — M47816 Spondylosis without myelopathy or radiculopathy, lumbar region: Secondary | ICD-10-CM | POA: Diagnosis not present

## 2022-04-21 DIAGNOSIS — M5412 Radiculopathy, cervical region: Secondary | ICD-10-CM | POA: Diagnosis not present

## 2022-05-04 DIAGNOSIS — M5412 Radiculopathy, cervical region: Secondary | ICD-10-CM | POA: Diagnosis not present

## 2022-05-26 DIAGNOSIS — M5412 Radiculopathy, cervical region: Secondary | ICD-10-CM | POA: Diagnosis not present

## 2022-08-13 ENCOUNTER — Telehealth: Payer: Medicare Other | Admitting: Physician Assistant

## 2022-08-13 DIAGNOSIS — U071 COVID-19: Secondary | ICD-10-CM

## 2022-08-13 MED ORDER — MOLNUPIRAVIR EUA 200MG CAPSULE: 4.0000 | ORAL_CAPSULE | Freq: Two times a day (BID) | ORAL | 0 refills | Status: AC

## 2022-08-13 NOTE — Progress Notes (Signed)
Virtual Visit Consent   Logan Pitts, you are scheduled for a virtual visit with a [facility] provider today. Just as with appointments in the office, your consent must be obtained to participate. Your consent will be active for this visit and any virtual visit you may have with one of our providers in the next 365 days. If you have a MyChart account, a copy of this consent can be sent to you electronically.  As this is a virtual visit, video technology does not allow for your provider to perform a traditional examination. This may limit your provider's ability to fully assess your condition. If your provider identifies any concerns that need to be evaluated in person or the need to arrange testing (such as labs, EKG, etc.), we will make arrangements to do so. Although advances in technology are sophisticated, we cannot ensure that it will always work on either your end or our end. If the connection with a video visit is poor, the visit may have to be switched to a telephone visit. With either a video or telephone visit, we are not always able to ensure that we have a secure connection.  By engaging in this virtual visit, you consent to the provision of healthcare and authorize for your insurance to be billed (if applicable) for the services provided during this visit. Depending on your insurance coverage, you may receive a charge related to this service.  I need to obtain your verbal consent now. Are you willing to proceed with your visit today? THEOPHIL THIVIERGE has provided verbal consent on 08/13/2022 for a virtual visit (video or telephone). Mar Daring, PA-C  Date: 08/13/2022 1:18 PM  Virtual Visit via Video Note   I, Mar Daring, connected with  Logan Pitts  ([id number], [birth year]) on 08/13/22 at  1:15 PM EST by a video-enabled telemedicine application and verified that I am speaking with the correct person using two identifiers.  Location: Patient: Virtual Visit  Location Patient: Home Provider: Virtual Visit Location Provider: Home Office   I discussed the limitations of evaluation and management by telemedicine and the availability of in person appointments. The patient expressed understanding and agreed to proceed.    History of Present Illness: Logan Pitts is a 77 y.o. who identifies as a male who was assigned male at birth, and is being seen today for Covid 70.  HPI: URI  This is a new problem. Episode onset: Tested positive for Covid 19 this morning; Has had head cold congestion for over 3 weeks, has not had any change or worsening in symptoms; Was exposed to Covid 19 on Monday 08/07/22. Associated symptoms include congestion, headaches and rhinorrhea.     Problems:  Patient Active Problem List   Diagnosis Date Noted   Diabetes mellitus due to underlying condition without complication ([place]) 96/29/5284   Groin strain-left 11/20/2013    Allergies: No Known Allergies Medications:  Current Outpatient Medications:    losartan (COZAAR) 100 MG tablet, Take 50 mg by mouth daily., Disp: , Rfl:    molnupiravir EUA (LAGEVRIO) 200 mg CAPS capsule, Take 4 capsules (800 mg total) by mouth 2 (two) times daily for 5 days., Disp: 40 capsule, Rfl: 0   montelukast (SINGULAIR) 10 MG tablet, Take 1 tablet by mouth every morning., Disp: , Rfl:    tamsulosin (FLOMAX) 0.4 MG CAPS capsule, Take 1 capsule by mouth at bedtime., Disp: , Rfl:    albuterol (PROVENTIL HFA;VENTOLIN HFA) 108 (90 BASE) MCG/ACT inhaler,  Inhale 2 puffs into the lungs every 6 (six) hours as needed for wheezing., Disp: , Rfl:    amLODipine (NORVASC) 10 MG tablet, Take 10 mg by mouth every morning. , Disp: , Rfl:    aspirin 81 MG tablet, Take 81 mg by mouth daily., Disp: , Rfl:    atorvastatin (LIPITOR) 80 MG tablet, Take 80 mg by mouth every morning. , Disp: , Rfl:    budesonide-formoterol (SYMBICORT) 160-4.5 MCG/ACT inhaler, Inhale 2 puffs into the lungs every evening., Disp: , Rfl:     ezetimibe (ZETIA) 10 MG tablet, Take 10 mg by mouth every morning. , Disp: , Rfl:    flunisolide (NASALIDE) 25 MCG/ACT (0.025%) SOLN, Inhale 2 sprays into the lungs every evening. , Disp: , Rfl:    guaifenesin (HUMIBID E) 400 MG TABS tablet, Take 400 mg by mouth every 4 (four) hours., Disp: , Rfl:    insulin aspart protamine- aspart (NOVOLOG 70/30) (70-30) 100 UNIT/ML injection, Inject 5-15 Units into the skin 3 (three) times daily with meals. , Disp: , Rfl:    levothyroxine (SYNTHROID, LEVOTHROID) 200 MCG tablet, Take 200 mcg by mouth daily before breakfast., Disp: , Rfl:    meloxicam (MOBIC) 15 MG tablet, Take 15 mg by mouth daily. , Disp: , Rfl:    omeprazole (PRILOSEC) 20 MG capsule, Take 20 mg by mouth daily., Disp: , Rfl:    PRECISION XTRA TEST STRIPS test strip, , Disp: , Rfl:    pseudoephedrine (SUDAFED) 30 MG tablet, Take 30 mg by mouth every 4 (four) hours as needed for congestion., Disp: , Rfl:    TECHLITE LANCETS MISC, , Disp: , Rfl:    vardenafil (LEVITRA) 20 MG tablet, Take 20 mg by mouth daily as needed for erectile dysfunction., Disp: , Rfl:   Observations/Objective: Patient is well-developed, well-nourished in no acute distress.  Resting comfortably at home.  Head is normocephalic, atraumatic.  No labored breathing.  Speech is clear and coherent with logical content.  Patient is alert and oriented at baseline.    Assessment and Plan: 1. COVID-19 - molnupiravir EUA (LAGEVRIO) 200 mg CAPS capsule; Take 4 capsules (800 mg total) by mouth 2 (two) times daily for 5 days.  Dispense: 40 capsule; Refill: 0  - Continue OTC symptomatic management of choice - Will send OTC vitamins and supplement information through [facility] - Molnupiravir prescribed - Patient enrolled in MyChart symptom monitoring - Push fluids - Rest as needed - Discussed return precautions and when to seek in-person evaluation, sent via [facility] as well   Follow Up Instructions: I discussed the assessment and  treatment plan with the patient. The patient was provided an opportunity to ask questions and all were answered. The patient agreed with the plan and demonstrated an understanding of the instructions.  A copy of instructions were sent to the patient via MyChart unless otherwise noted below.    The patient was advised to call back or seek an in-person evaluation if the symptoms worsen or if the condition fails to improve as anticipated.  Time:  I spent 15 minutes with the patient via telehealth technology discussing the above problems/concerns.    Mar Daring, PA-C

## 2022-08-13 NOTE — Patient Instructions (Signed)
Logan Pitts, thank you for joining Mar Daring, PA-C for today's virtual visit.  While this provider is not your primary care provider (PCP), if your PCP is located in our provider database this encounter information will be shared with them immediately following your visit.   [facility] account gives you access to today's visit and all your visits, tests, and labs performed at [facility] " click here if you don't have a [facility] account or go to [email or web address]  Consent: (Patient) Logan Pitts provided verbal consent for this virtual visit at the beginning of the encounter.  Current Medications:  Current Outpatient Medications:    losartan (COZAAR) 100 MG tablet, Take 50 mg by mouth daily., Disp: , Rfl:    molnupiravir EUA (LAGEVRIO) 200 mg CAPS capsule, Take 4 capsules (800 mg total) by mouth 2 (two) times daily for 5 days., Disp: 40 capsule, Rfl: 0   montelukast (SINGULAIR) 10 MG tablet, Take 1 tablet by mouth every morning., Disp: , Rfl:    tamsulosin (FLOMAX) 0.4 MG CAPS capsule, Take 1 capsule by mouth at bedtime., Disp: , Rfl:    albuterol (PROVENTIL HFA;VENTOLIN HFA) 108 (90 BASE) MCG/ACT inhaler, Inhale 2 puffs into the lungs every 6 (six) hours as needed for wheezing., Disp: , Rfl:    amLODipine (NORVASC) 10 MG tablet, Take 10 mg by mouth every morning. , Disp: , Rfl:    aspirin 81 MG tablet, Take 81 mg by mouth daily., Disp: , Rfl:    atorvastatin (LIPITOR) 80 MG tablet, Take 80 mg by mouth every morning. , Disp: , Rfl:    budesonide-formoterol (SYMBICORT) 160-4.5 MCG/ACT inhaler, Inhale 2 puffs into the lungs every evening., Disp: , Rfl:    ezetimibe (ZETIA) 10 MG tablet, Take 10 mg by mouth every morning. , Disp: , Rfl:    flunisolide (NASALIDE) 25 MCG/ACT (0.025%) SOLN, Inhale 2 sprays into the lungs every evening. , Disp: , Rfl:    guaifenesin (HUMIBID E) 400 MG TABS tablet, Take 400 mg by mouth every 4 (four) hours.,  Disp: , Rfl:    insulin aspart protamine- aspart (NOVOLOG 70/30) (70-30) 100 UNIT/ML injection, Inject 5-15 Units into the skin 3 (three) times daily with meals. , Disp: , Rfl:    levothyroxine (SYNTHROID, LEVOTHROID) 200 MCG tablet, Take 200 mcg by mouth daily before breakfast., Disp: , Rfl:    meloxicam (MOBIC) 15 MG tablet, Take 15 mg by mouth daily. , Disp: , Rfl:    omeprazole (PRILOSEC) 20 MG capsule, Take 20 mg by mouth daily., Disp: , Rfl:    PRECISION XTRA TEST STRIPS test strip, , Disp: , Rfl:    pseudoephedrine (SUDAFED) 30 MG tablet, Take 30 mg by mouth every 4 (four) hours as needed for congestion., Disp: , Rfl:    TECHLITE LANCETS MISC, , Disp: , Rfl:    vardenafil (LEVITRA) 20 MG tablet, Take 20 mg by mouth daily as needed for erectile dysfunction., Disp: , Rfl:    Medications ordered in this encounter:  Meds ordered this encounter  Medications   molnupiravir EUA (LAGEVRIO) 200 mg CAPS capsule    Sig: Take 4 capsules (800 mg total) by mouth 2 (two) times daily for 5 days.    Dispense:  40 capsule    Refill:  0    Order Specific Question:   Supervising Provider    Answer:   Chase Picket [id number]     *If you need refills on other  medications prior to your next appointment, please contact your pharmacy*  Follow-Up: Call back or seek an in-person evaluation if the symptoms worsen or if the condition fails to improve as anticipated.  [facility] [phone]  Isolation Instructions: You are to isolate at home for 5 days from onset of your symptoms. If you must be around other household members who do not have symptoms, you need to make sure that both you and the family members are masking consistently with a high-quality mask.  After day 5 of isolation, if you have had no fever within 24 hours and you are feeling better, you can end isolation but need to mask for an additional 5 days.  After day 5 if you have a fever or are having significant symptoms,  please isolate for full 10 days.  If you note any worsening of symptoms despite treatment, please seek an in-person evaluation ASAP. If you note any significant shortness of breath or any chest pain, please seek ER evaluation. Please do not delay care!   COVID-19: What to Do if You Are Sick If you test positive and are an older adult or someone who is at high risk of getting very sick from COVID-19, treatment may be available. Contact a healthcare provider right away after a positive test to determine if you are eligible, even if your symptoms are mild right now. You can also visit a Test to Treat location and, if eligible, receive a prescription from a provider. Don't delay: Treatment must be started within the first few days to be effective. If you have a fever, cough, or other symptoms, you might have COVID-19. Most people have mild illness and are able to recover at home. If you are sick: Keep track of your symptoms. If you have an emergency warning sign (including trouble breathing), call 911. Steps to help prevent the spread of COVID-19 if you are sick If you are sick with COVID-19 or think you might have COVID-19, follow the steps below to care for yourself and to help protect other people in your home and community. Stay home except to get medical care Stay home. Most people with COVID-19 have mild illness and can recover at home without medical care. Do not leave your home, except to get medical care. Do not visit public areas and do not go to places where you are unable to wear a mask. Take care of yourself. Get rest and stay hydrated. Take over-the-counter medicines, such as acetaminophen, to help you feel better. Stay in touch with your doctor. Call before you get medical care. Be sure to get care if you have trouble breathing, or have any other emergency warning signs, or if you think it is an emergency. Avoid public transportation, ride-sharing, or taxis if possible. Get tested If you  have symptoms of COVID-19, get tested. While waiting for test results, stay away from others, including staying apart from those living in your household. Get tested as soon as possible after your symptoms start. Treatments may be available for people with COVID-19 who are at risk for becoming very sick. Don't delay: Treatment must be started early to be effective--some treatments must begin within 5 days of your first symptoms. Contact your healthcare provider right away if your test result is positive to determine if you are eligible. Self-tests are one of several options for testing for the virus that causes COVID-19 and may be more convenient than laboratory-based tests and point-of-care tests. Ask your healthcare provider  or your local health department if you need help interpreting your test results. You can visit your state, tribal, local, and territorial health department's website to look for the latest local information on testing sites. Separate yourself from other people As much as possible, stay in a specific room and away from other people and pets in your home. If possible, you should use a separate bathroom. If you need to be around other people or animals in or outside of the home, wear a well-fitting mask. Tell your close contacts that they may have been exposed to COVID-19. An infected person can spread COVID-19 starting 48 hours (or 2 days) before the person has any symptoms or tests positive. By letting your close contacts know they may have been exposed to COVID-19, you are helping to protect everyone. See COVID-19 and Animals if you have questions about pets. If you are diagnosed with COVID-19, someone from the health department may call you. Answer the call to slow the spread. Monitor your symptoms Symptoms of COVID-19 include fever, cough, or other symptoms. Follow care instructions from your healthcare provider and local health department. Your local health authorities may give  instructions on checking your symptoms and reporting information. When to seek emergency medical attention Look for emergency warning signs* for COVID-19. If someone is showing any of these signs, seek emergency medical care immediately: Trouble breathing Persistent pain or pressure in the chest New confusion Inability to wake or stay awake Pale, gray, or blue-colored skin, lips, or nail beds, depending on skin tone *This list is not all possible symptoms. Please call your medical provider for any other symptoms that are severe or concerning to you. Call 911 or call ahead to your local emergency facility: Notify the operator that you are seeking care for someone who has or may have COVID-19. Call ahead before visiting your doctor Call ahead. Many medical visits for routine care are being postponed or done by phone or telemedicine. If you have a medical appointment that cannot be postponed, call your doctor's office, and tell them you have or may have COVID-19. This will help the office protect themselves and other patients. If you are sick, wear a well-fitting mask You should wear a mask if you must be around other people or animals, including pets (even at home). Wear a mask with the best fit, protection, and comfort for you. You don't need to wear the mask if you are alone. If you can't put on a mask (because of trouble breathing, for example), cover your coughs and sneezes in some other way. Try to stay at least 6 feet away from other people. This will help protect the people around you. Masks should not be placed on young children under age 38 years, anyone who has trouble breathing, or anyone who is not able to remove the mask without help. Cover your coughs and sneezes Cover your mouth and nose with a tissue when you cough or sneeze. Throw away used tissues in a lined trash can. Immediately wash your hands with soap and water for at least 20 seconds. If soap and water are not available,  clean your hands with an alcohol-based hand sanitizer that contains at least 60% alcohol. Clean your hands often Wash your hands often with soap and water for at least 20 seconds. This is especially important after blowing your nose, coughing, or sneezing; going to the bathroom; and before eating or preparing food. Use hand sanitizer if soap and water are not available. Use  an alcohol-based hand sanitizer with at least 60% alcohol, covering all surfaces of your hands and rubbing them together until they feel dry. Soap and water are the best option, especially if hands are visibly dirty. Avoid touching your eyes, nose, and mouth with unwashed hands. Handwashing Tips Avoid sharing personal household items Do not share dishes, drinking glasses, cups, eating utensils, towels, or bedding with other people in your home. Wash these items thoroughly after using them with soap and water or put in the dishwasher. Clean surfaces in your home regularly Clean and disinfect high-touch surfaces (for example, doorknobs, tables, handles, light switches, and countertops) in your "sick room" and bathroom. In shared spaces, you should clean and disinfect surfaces and items after each use by the person who is ill. If you are sick and cannot clean, a caregiver or other person should only clean and disinfect the area around you (such as your bedroom and bathroom) on an as needed basis. Your caregiver/other person should wait as long as possible (at least several hours) and wear a mask before entering, cleaning, and disinfecting shared spaces that you use. Clean and disinfect areas that may have blood, stool, or body fluids on them. Use household cleaners and disinfectants. Clean visible dirty surfaces with household cleaners containing soap or detergent. Then, use a household disinfectant. Use a product from [employer] List N: Disinfectants for Coronavirus (UORVI-15). Be sure to follow the instructions on the label to ensure  safe and effective use of the product. Many products recommend keeping the surface wet with a disinfectant for a certain period of time (look at "contact time" on the product label). You may also need to wear personal protective equipment, such as gloves, depending on the directions on the product label. Immediately after disinfecting, wash your hands with soap and water for 20 seconds. For completed guidance on cleaning and disinfecting your home, visit Complete Disinfection Guidance. Take steps to improve ventilation at home Improve ventilation (air flow) at home to help prevent from spreading COVID-19 to other people in your household. Clear out COVID-19 virus particles in the air by opening windows, using air filters, and turning on fans in your home. Use this interactive tool to learn how to improve air flow in your home. When you can be around others after being sick with COVID-19 Deciding when you can be around others is different for different situations. Find out when you can safely end home isolation. For any additional questions about your care, contact your healthcare provider or state or local health department. 10/05/2020 Content source: [facility] ([facility]), Division of Viral Diseases This information is not intended to replace advice given to you by your health care provider. Make sure you discuss any questions you have with your health care provider. Document Revised: 11/18/2020 Document Reviewed: 11/18/2020 Elsevier Patient Education  2022 [employer].   If you have been instructed to have an in-person evaluation today at a local Urgent Care facility, please use the link below. It will take you to a list of all of our available [facility], including address, phone number and hours of operation. Please do not delay care.  [facility]  If you or a family member do not have a primary care provider, use the  link below to schedule a visit and establish care. When you choose a  primary care physician or advanced practice provider, you gain a long-term partner in health. Find a Primary  Care Provider  Learn more about [facility]'s in-office and virtual care options: [facility]
